# Patient Record
Sex: Female | Born: 1980 | Race: White | Hispanic: No | Marital: Married | Smoking: Never smoker
Health system: Southern US, Community
[De-identification: ages and names within clinical notes are randomized; demographics above are authoritative.]

## PROBLEM LIST (undated history)

## (undated) DIAGNOSIS — Z20828 Contact with and (suspected) exposure to other viral communicable diseases: Secondary | ICD-10-CM

## (undated) DIAGNOSIS — O021 Missed abortion: Secondary | ICD-10-CM

## (undated) DIAGNOSIS — Z8619 Personal history of other infectious and parasitic diseases: Secondary | ICD-10-CM

## (undated) DIAGNOSIS — R112 Nausea with vomiting, unspecified: Secondary | ICD-10-CM

## (undated) DIAGNOSIS — E039 Hypothyroidism, unspecified: Secondary | ICD-10-CM

## (undated) DIAGNOSIS — Z9889 Other specified postprocedural states: Secondary | ICD-10-CM

## (undated) HISTORY — DX: Contact with and (suspected) exposure to other viral communicable diseases: Z20.828

## (undated) HISTORY — PX: WISDOM TOOTH EXTRACTION: SHX21

## (undated) HISTORY — DX: Hypothyroidism, unspecified: E03.9

## (undated) HISTORY — DX: Personal history of other infectious and parasitic diseases: Z86.19

---

## 2001-04-07 ENCOUNTER — Ambulatory Visit (HOSPITAL_COMMUNITY): Admission: RE | Admit: 2001-04-07 | Discharge: 2001-04-07 | Payer: Self-pay | Admitting: *Deleted

## 2001-04-07 ENCOUNTER — Encounter: Payer: Self-pay | Admitting: *Deleted

## 2004-12-15 ENCOUNTER — Emergency Department (HOSPITAL_COMMUNITY): Admission: EM | Admit: 2004-12-15 | Discharge: 2004-12-15 | Payer: Self-pay | Admitting: Family Medicine

## 2005-11-29 ENCOUNTER — Other Ambulatory Visit: Admission: RE | Admit: 2005-11-29 | Discharge: 2005-11-29 | Payer: Self-pay | Admitting: Internal Medicine

## 2006-12-05 ENCOUNTER — Other Ambulatory Visit: Admission: RE | Admit: 2006-12-05 | Discharge: 2006-12-05 | Payer: Self-pay | Admitting: *Deleted

## 2009-08-20 ENCOUNTER — Inpatient Hospital Stay (HOSPITAL_COMMUNITY): Admission: AD | Admit: 2009-08-20 | Discharge: 2009-08-22 | Payer: Self-pay | Admitting: Obstetrics and Gynecology

## 2010-05-24 LAB — CBC
HCT: 38.1 % (ref 36.0–46.0)
HCT: 42.1 % (ref 36.0–46.0)
Hemoglobin: 13.4 g/dL (ref 12.0–15.0)
Hemoglobin: 14.7 g/dL (ref 12.0–15.0)
MCHC: 35 g/dL (ref 30.0–36.0)
MCHC: 35.2 g/dL (ref 30.0–36.0)
MCV: 96.3 fL (ref 78.0–100.0)
MCV: 97.7 fL (ref 78.0–100.0)
Platelets: 170 10*3/uL (ref 150–400)
Platelets: 188 10*3/uL (ref 150–400)
RBC: 3.9 MIL/uL (ref 3.87–5.11)
RBC: 4.37 MIL/uL (ref 3.87–5.11)
RDW: 12.3 % (ref 11.5–15.5)
RDW: 12.7 % (ref 11.5–15.5)
WBC: 13.5 10*3/uL — ABNORMAL HIGH (ref 4.0–10.5)
WBC: 15.3 10*3/uL — ABNORMAL HIGH (ref 4.0–10.5)

## 2010-05-24 LAB — RPR: RPR Ser Ql: NONREACTIVE

## 2011-01-14 LAB — OB RESULTS CONSOLE RPR: RPR: NONREACTIVE

## 2011-01-14 LAB — OB RESULTS CONSOLE HIV ANTIBODY (ROUTINE TESTING): HIV: NONREACTIVE

## 2011-01-14 LAB — OB RESULTS CONSOLE ABO/RH: RH Type: POSITIVE

## 2011-02-01 LAB — OB RESULTS CONSOLE GC/CHLAMYDIA
Chlamydia: NEGATIVE
Gonorrhea: NEGATIVE

## 2011-08-12 LAB — OB RESULTS CONSOLE GBS: GBS: NEGATIVE

## 2011-08-24 ENCOUNTER — Encounter (HOSPITAL_COMMUNITY): Payer: Self-pay | Admitting: *Deleted

## 2011-08-24 ENCOUNTER — Telehealth (HOSPITAL_COMMUNITY): Payer: Self-pay | Admitting: *Deleted

## 2011-08-24 NOTE — Telephone Encounter (Signed)
Preadmission screen  

## 2011-08-26 ENCOUNTER — Inpatient Hospital Stay (HOSPITAL_COMMUNITY): Admission: AD | Admit: 2011-08-26 | Payer: Self-pay | Source: Ambulatory Visit | Admitting: Obstetrics and Gynecology

## 2011-08-27 ENCOUNTER — Encounter (HOSPITAL_COMMUNITY): Payer: Self-pay | Admitting: Anesthesiology

## 2011-08-27 ENCOUNTER — Inpatient Hospital Stay (HOSPITAL_COMMUNITY): Payer: BC Managed Care – PPO | Admitting: Anesthesiology

## 2011-08-27 ENCOUNTER — Encounter (HOSPITAL_COMMUNITY): Payer: Self-pay

## 2011-08-27 ENCOUNTER — Inpatient Hospital Stay (HOSPITAL_COMMUNITY)
Admission: RE | Admit: 2011-08-27 | Discharge: 2011-08-29 | DRG: 371 | Disposition: A | Discharge status: Home / Self Care | Payer: BC Managed Care – PPO | Source: Ambulatory Visit | Attending: Obstetrics & Gynecology | Admitting: Obstetrics & Gynecology

## 2011-08-27 ENCOUNTER — Encounter (HOSPITAL_COMMUNITY)
Admission: RE | Disposition: A | Discharge status: Home / Self Care | Payer: Self-pay | Source: Ambulatory Visit | Attending: Obstetrics & Gynecology

## 2011-08-27 VITALS — BP 108/72 | HR 54 | Temp 97.9°F | Resp 18 | Ht 63.0 in | Wt 180.0 lb

## 2011-08-27 DIAGNOSIS — E079 Disorder of thyroid, unspecified: Principal | ICD-10-CM | POA: Diagnosis present

## 2011-08-27 DIAGNOSIS — O99284 Endocrine, nutritional and metabolic diseases complicating childbirth: Principal | ICD-10-CM | POA: Diagnosis present

## 2011-08-27 DIAGNOSIS — E039 Hypothyroidism, unspecified: Secondary | ICD-10-CM | POA: Diagnosis present

## 2011-08-27 LAB — CBC
HCT: 42.2 % (ref 36.0–46.0)
Hemoglobin: 14.8 g/dL (ref 12.0–15.0)
MCH: 32.1 pg (ref 26.0–34.0)
MCHC: 35.1 g/dL (ref 30.0–36.0)
MCV: 91.5 fL (ref 78.0–100.0)
Platelets: 180 10*3/uL (ref 150–400)
RBC: 4.61 MIL/uL (ref 3.87–5.11)
RDW: 12.9 % (ref 11.5–15.5)
WBC: 10.6 10*3/uL — ABNORMAL HIGH (ref 4.0–10.5)

## 2011-08-27 LAB — RPR: RPR Ser Ql: NONREACTIVE

## 2011-08-27 LAB — TYPE AND SCREEN
ABO/RH(D): A POS
Antibody Screen: NEGATIVE

## 2011-08-27 LAB — ABO/RH: ABO/RH(D): A POS

## 2011-08-27 SURGERY — Surgical Case
Anesthesia: Epidural | Site: Abdomen | Wound class: Clean Contaminated

## 2011-08-27 MED ORDER — SIMETHICONE 80 MG PO CHEW: 80.0000 mg | CHEWABLE_TABLET | ORAL | Status: DC | PRN

## 2011-08-27 MED ORDER — SODIUM BICARBONATE 8.4 % IV SOLN
INTRAVENOUS | Status: DC | PRN
  Administered 2011-08-27: 4 mL via EPIDURAL

## 2011-08-27 MED ORDER — FENTANYL 2.5 MCG/ML BUPIVACAINE 1/10 % EPIDURAL INFUSION (WH - ANES)
14.0000 mL/h | INTRAMUSCULAR | Status: DC
  Filled 2011-08-27: qty 60

## 2011-08-27 MED ORDER — KETOROLAC TROMETHAMINE 30 MG/ML IJ SOLN: 30.0000 mg | Freq: Four times a day (QID) | INTRAMUSCULAR | Status: AC | PRN

## 2011-08-27 MED ORDER — DOCUSATE SODIUM 100 MG PO CAPS: 200.0000 mg | ORAL_CAPSULE | Freq: Every day | ORAL | Status: DC | PRN

## 2011-08-27 MED ORDER — CITRIC ACID-SODIUM CITRATE 334-500 MG/5ML PO SOLN
ORAL | Status: AC
  Administered 2011-08-27: 30 mL via ORAL
  Filled 2011-08-27: qty 15

## 2011-08-27 MED ORDER — LANOLIN HYDROUS EX OINT: 1.0000 "application " | TOPICAL_OINTMENT | CUTANEOUS | Status: DC | PRN

## 2011-08-27 MED ORDER — OXYTOCIN 40 UNITS IN LACTATED RINGERS INFUSION - SIMPLE MED
INTRAVENOUS | Status: DC | PRN
  Administered 2011-08-27: 40 [IU] via INTRAVENOUS

## 2011-08-27 MED ORDER — PHENYLEPHRINE 40 MCG/ML (10ML) SYRINGE FOR IV PUSH (FOR BLOOD PRESSURE SUPPORT): 80.0000 ug | PREFILLED_SYRINGE | INTRAVENOUS | Status: DC | PRN

## 2011-08-27 MED ORDER — IBUPROFEN 600 MG PO TABS: 600.0000 mg | ORAL_TABLET | Freq: Four times a day (QID) | ORAL | Status: DC | PRN

## 2011-08-27 MED ORDER — LACTATED RINGERS IV SOLN: 500.0000 mL | INTRAVENOUS | Status: DC | PRN

## 2011-08-27 MED ORDER — ONDANSETRON HCL 4 MG/2ML IJ SOLN: 4.0000 mg | INTRAMUSCULAR | Status: DC | PRN

## 2011-08-27 MED ORDER — LORATADINE 10 MG PO TABS
10.0000 mg | ORAL_TABLET | Freq: Every day | ORAL | Status: DC
  Administered 2011-08-28: 10 mg via ORAL
  Filled 2011-08-27 (×3): qty 1

## 2011-08-27 MED ORDER — MORPHINE SULFATE (PF) 0.5 MG/ML IJ SOLN
INTRAMUSCULAR | Status: DC | PRN
  Administered 2011-08-27: 1 mg via INTRAVENOUS
  Administered 2011-08-27: 4 mg via EPIDURAL

## 2011-08-27 MED ORDER — KETOROLAC TROMETHAMINE 30 MG/ML IJ SOLN
INTRAMUSCULAR | Status: AC
  Administered 2011-08-27: 30 mg via INTRAVENOUS
  Filled 2011-08-27: qty 1

## 2011-08-27 MED ORDER — OXYTOCIN 40 UNITS IN LACTATED RINGERS INFUSION - SIMPLE MED
1.0000 m[IU]/min | INTRAVENOUS | Status: DC
  Administered 2011-08-27: 2 m[IU]/min via INTRAVENOUS
  Filled 2011-08-27: qty 1000

## 2011-08-27 MED ORDER — SENNOSIDES-DOCUSATE SODIUM 8.6-50 MG PO TABS
2.0000 | ORAL_TABLET | Freq: Every day | ORAL | Status: DC
  Administered 2011-08-27 – 2011-08-28 (×2): 2 via ORAL

## 2011-08-27 MED ORDER — LACTATED RINGERS IV SOLN
INTRAVENOUS | Status: DC | PRN
  Administered 2011-08-27 (×2): via INTRAVENOUS

## 2011-08-27 MED ORDER — NALBUPHINE HCL 10 MG/ML IJ SOLN: 5.0000 mg | INTRAMUSCULAR | Status: DC | PRN

## 2011-08-27 MED ORDER — IBUPROFEN 600 MG PO TABS
600.0000 mg | ORAL_TABLET | Freq: Four times a day (QID) | ORAL | Status: DC
  Administered 2011-08-27 – 2011-08-29 (×6): 600 mg via ORAL
  Filled 2011-08-27 (×6): qty 1

## 2011-08-27 MED ORDER — HYDROMORPHONE HCL PF 1 MG/ML IJ SOLN
INTRAMUSCULAR | Status: AC
  Filled 2011-08-27: qty 1

## 2011-08-27 MED ORDER — SCOPOLAMINE 1 MG/3DAYS TD PT72
MEDICATED_PATCH | TRANSDERMAL | Status: AC
  Filled 2011-08-27: qty 1

## 2011-08-27 MED ORDER — DIPHENHYDRAMINE HCL 25 MG PO CAPS: 25.0000 mg | ORAL_CAPSULE | Freq: Four times a day (QID) | ORAL | Status: DC | PRN

## 2011-08-27 MED ORDER — EPHEDRINE 5 MG/ML INJ
10.0000 mg | INTRAVENOUS | Status: DC | PRN
  Filled 2011-08-27: qty 4

## 2011-08-27 MED ORDER — CITRIC ACID-SODIUM CITRATE 334-500 MG/5ML PO SOLN: 30.0000 mL | ORAL | Status: DC | PRN

## 2011-08-27 MED ORDER — SCOPOLAMINE 1 MG/3DAYS TD PT72
1.0000 | MEDICATED_PATCH | Freq: Once | TRANSDERMAL | Status: DC
  Administered 2011-08-27: 1.5 mg via TRANSDERMAL

## 2011-08-27 MED ORDER — PHENYLEPHRINE 40 MCG/ML (10ML) SYRINGE FOR IV PUSH (FOR BLOOD PRESSURE SUPPORT)
PREFILLED_SYRINGE | INTRAVENOUS | Status: AC
  Filled 2011-08-27: qty 5

## 2011-08-27 MED ORDER — SODIUM CHLORIDE 0.9 % IJ SOLN: 3.0000 mL | INTRAMUSCULAR | Status: DC | PRN

## 2011-08-27 MED ORDER — MORPHINE SULFATE 0.5 MG/ML IJ SOLN
INTRAMUSCULAR | Status: AC
  Filled 2011-08-27: qty 10

## 2011-08-27 MED ORDER — ONDANSETRON HCL 4 MG/2ML IJ SOLN
4.0000 mg | Freq: Four times a day (QID) | INTRAMUSCULAR | Status: DC | PRN
  Filled 2011-08-27: qty 2

## 2011-08-27 MED ORDER — LACTATED RINGERS IV SOLN
INTRAVENOUS | Status: DC
  Administered 2011-08-27 (×2): via INTRAVENOUS

## 2011-08-27 MED ORDER — PRENATAL MULTIVITAMIN CH
1.0000 | ORAL_TABLET | Freq: Every day | ORAL | Status: DC
  Administered 2011-08-28: 1 via ORAL
  Filled 2011-08-27: qty 1

## 2011-08-27 MED ORDER — SIMETHICONE 80 MG PO CHEW
80.0000 mg | CHEWABLE_TABLET | Freq: Three times a day (TID) | ORAL | Status: DC
  Administered 2011-08-27 – 2011-08-29 (×4): 80 mg via ORAL

## 2011-08-27 MED ORDER — PHENYLEPHRINE 40 MCG/ML (10ML) SYRINGE FOR IV PUSH (FOR BLOOD PRESSURE SUPPORT)
80.0000 ug | PREFILLED_SYRINGE | INTRAVENOUS | Status: DC | PRN
  Filled 2011-08-27: qty 5

## 2011-08-27 MED ORDER — NALOXONE HCL 0.4 MG/ML IJ SOLN
0.4000 mg | INTRAMUSCULAR | Status: DC | PRN
Start: 2011-08-27 — End: 2011-08-29

## 2011-08-27 MED ORDER — MENTHOL 3 MG MT LOZG: 1.0000 | LOZENGE | OROMUCOSAL | Status: DC | PRN

## 2011-08-27 MED ORDER — KETOROLAC TROMETHAMINE 60 MG/2ML IM SOLN: 60.0000 mg | Freq: Once | INTRAMUSCULAR | Status: AC | PRN

## 2011-08-27 MED ORDER — DIPHENHYDRAMINE HCL 50 MG/ML IJ SOLN
25.0000 mg | INTRAMUSCULAR | Status: DC | PRN
Start: 2011-08-27 — End: 2011-08-29

## 2011-08-27 MED ORDER — CEFAZOLIN SODIUM 1-5 GM-% IV SOLN
INTRAVENOUS | Status: DC | PRN
  Administered 2011-08-27: 2 g via INTRAVENOUS

## 2011-08-27 MED ORDER — LIDOCAINE HCL (PF) 1 % IJ SOLN: 30.0000 mL | INTRAMUSCULAR | Status: DC | PRN

## 2011-08-27 MED ORDER — SODIUM CHLORIDE 0.9 % IV SOLN: 1.0000 ug/kg/h | INTRAVENOUS | Status: DC | PRN

## 2011-08-27 MED ORDER — ONDANSETRON HCL 4 MG/2ML IJ SOLN
INTRAMUSCULAR | Status: AC
  Filled 2011-08-27: qty 2

## 2011-08-27 MED ORDER — DIPHENHYDRAMINE HCL 50 MG/ML IJ SOLN
12.5000 mg | INTRAMUSCULAR | Status: DC | PRN
  Administered 2011-08-28: 12.5 mg via INTRAVENOUS
  Filled 2011-08-27: qty 1

## 2011-08-27 MED ORDER — DIPHENHYDRAMINE HCL 50 MG/ML IJ SOLN: 12.5000 mg | INTRAMUSCULAR | Status: DC | PRN

## 2011-08-27 MED ORDER — DIPHENHYDRAMINE HCL 25 MG PO CAPS: 25.0000 mg | ORAL_CAPSULE | ORAL | Status: DC | PRN

## 2011-08-27 MED ORDER — WITCH HAZEL-GLYCERIN EX PADS: 1.0000 "application " | MEDICATED_PAD | CUTANEOUS | Status: DC | PRN

## 2011-08-27 MED ORDER — LACTATED RINGERS IV SOLN
INTRAVENOUS | Status: DC
  Administered 2011-08-27 (×2): via INTRAVENOUS

## 2011-08-27 MED ORDER — LORATADINE 10 MG PO TABS
10.0000 mg | ORAL_TABLET | Freq: Every day | ORAL | Status: DC
  Filled 2011-08-27 (×2): qty 1

## 2011-08-27 MED ORDER — ONDANSETRON HCL 4 MG PO TABS: 4.0000 mg | ORAL_TABLET | ORAL | Status: DC | PRN

## 2011-08-27 MED ORDER — ACETAMINOPHEN 325 MG PO TABS: 650.0000 mg | ORAL_TABLET | ORAL | Status: DC | PRN

## 2011-08-27 MED ORDER — EPHEDRINE 5 MG/ML INJ: 10.0000 mg | INTRAVENOUS | Status: DC | PRN

## 2011-08-27 MED ORDER — PRENATAL MULTIVITAMIN CH: 1.0000 | ORAL_TABLET | Freq: Every day | ORAL | Status: DC

## 2011-08-27 MED ORDER — OXYCODONE-ACETAMINOPHEN 5-325 MG PO TABS
1.0000 | ORAL_TABLET | ORAL | Status: DC | PRN
  Administered 2011-08-28 – 2011-08-29 (×4): 1 via ORAL
  Filled 2011-08-27 (×4): qty 1

## 2011-08-27 MED ORDER — HYDROMORPHONE HCL PF 1 MG/ML IJ SOLN
0.2500 mg | INTRAMUSCULAR | Status: DC | PRN
  Administered 2011-08-27: 0.5 mg via INTRAVENOUS
  Administered 2011-08-27 (×3): 0.25 mg via INTRAVENOUS

## 2011-08-27 MED ORDER — METOCLOPRAMIDE HCL 5 MG/ML IJ SOLN
10.0000 mg | Freq: Three times a day (TID) | INTRAMUSCULAR | Status: DC | PRN
  Filled 2011-08-27: qty 2

## 2011-08-27 MED ORDER — DIBUCAINE 1 % RE OINT: 1.0000 "application " | TOPICAL_OINTMENT | RECTAL | Status: DC | PRN

## 2011-08-27 MED ORDER — TETANUS-DIPHTH-ACELL PERTUSSIS 5-2.5-18.5 LF-MCG/0.5 IM SUSP: 0.5000 mL | Freq: Once | INTRAMUSCULAR | Status: DC

## 2011-08-27 MED ORDER — LEVOTHYROXINE SODIUM 125 MCG PO TABS
125.0000 ug | ORAL_TABLET | Freq: Every day | ORAL | Status: DC
  Administered 2011-08-28 – 2011-08-29 (×2): 125 ug via ORAL
  Filled 2011-08-27 (×2): qty 1

## 2011-08-27 MED ORDER — FENTANYL 2.5 MCG/ML BUPIVACAINE 1/10 % EPIDURAL INFUSION (WH - ANES)
INTRAMUSCULAR | Status: DC | PRN
  Administered 2011-08-27: 13 mL/h via EPIDURAL

## 2011-08-27 MED ORDER — MEPERIDINE HCL 25 MG/ML IJ SOLN: 6.2500 mg | INTRAMUSCULAR | Status: DC | PRN

## 2011-08-27 MED ORDER — EPHEDRINE 5 MG/ML INJ
INTRAVENOUS | Status: AC
  Filled 2011-08-27: qty 10

## 2011-08-27 MED ORDER — FLUTICASONE PROPIONATE 50 MCG/ACT NA SUSP
2.0000 | Freq: Every day | NASAL | Status: DC | PRN
  Filled 2011-08-27: qty 16

## 2011-08-27 MED ORDER — ONDANSETRON HCL 4 MG/2ML IJ SOLN: 4.0000 mg | Freq: Three times a day (TID) | INTRAMUSCULAR | Status: DC | PRN

## 2011-08-27 MED ORDER — OXYTOCIN 40 UNITS IN LACTATED RINGERS INFUSION - SIMPLE MED: 62.5000 mL/h | INTRAVENOUS | Status: AC

## 2011-08-27 MED ORDER — ZOLPIDEM TARTRATE 5 MG PO TABS: 5.0000 mg | ORAL_TABLET | Freq: Every evening | ORAL | Status: DC | PRN

## 2011-08-27 MED ORDER — OXYTOCIN 10 UNIT/ML IJ SOLN
INTRAMUSCULAR | Status: AC
  Filled 2011-08-27: qty 4

## 2011-08-27 MED ORDER — LACTATED RINGERS IV SOLN: 500.0000 mL | Freq: Once | INTRAVENOUS | Status: DC

## 2011-08-27 SURGICAL SUPPLY — 45 items
APL SKNCLS STERI-STRIP NONHPOA (GAUZE/BANDAGES/DRESSINGS) ×1
BENZOIN TINCTURE PRP APPL 2/3 (GAUZE/BANDAGES/DRESSINGS) ×1 IMPLANT
CHLORAPREP W/TINT 26ML (MISCELLANEOUS) ×1 IMPLANT
CLOTH BEACON ORANGE TIMEOUT ST (SAFETY) ×2 IMPLANT
CONTAINER PREFILL 10% NBF 15ML (MISCELLANEOUS) IMPLANT
DRESSING TELFA 8X3 (GAUZE/BANDAGES/DRESSINGS) ×2 IMPLANT
DRSG COVADERM 4X10 (GAUZE/BANDAGES/DRESSINGS) ×1 IMPLANT
ELECT REM PT RETURN 9FT ADLT (ELECTROSURGICAL) ×2
ELECTRODE REM PT RTRN 9FT ADLT (ELECTROSURGICAL) ×1 IMPLANT
EXTRACTOR VACUUM KIWI (MISCELLANEOUS) IMPLANT
EXTRACTOR VACUUM M CUP 4 TUBE (SUCTIONS) ×1 IMPLANT
GAUZE SPONGE 4X4 12PLY STRL LF (GAUZE/BANDAGES/DRESSINGS) ×4 IMPLANT
GLOVE BIO SURGEON STRL SZ7 (GLOVE) ×2 IMPLANT
GLOVE BIOGEL PI IND STRL 7.0 (GLOVE) ×2 IMPLANT
GLOVE BIOGEL PI INDICATOR 7.0 (GLOVE) ×2
GLOVE SURG SS PI 6.5 STRL IVOR (GLOVE) ×4 IMPLANT
GLOVE SURG SS PI 7.0 STRL IVOR (GLOVE) ×4 IMPLANT
GOWN PREVENTION PLUS LG XLONG (DISPOSABLE) ×7 IMPLANT
KIT ABG SYR 3ML LUER SLIP (SYRINGE) ×1 IMPLANT
NDL HYPO 25X5/8 SAFETYGLIDE (NEEDLE) IMPLANT
NEEDLE HYPO 25X5/8 SAFETYGLIDE (NEEDLE) IMPLANT
NS IRRIG 1000ML POUR BTL (IV SOLUTION) ×2 IMPLANT
PACK C SECTION WH (CUSTOM PROCEDURE TRAY) ×2 IMPLANT
PAD ABD 7.5X8 STRL (GAUZE/BANDAGES/DRESSINGS) ×2 IMPLANT
RTRCTR C-SECT PINK 25CM LRG (MISCELLANEOUS) IMPLANT
SLEEVE SCD COMPRESS KNEE MED (MISCELLANEOUS) IMPLANT
SPONGE GAUZE 4X4 12PLY (GAUZE/BANDAGES/DRESSINGS) ×1 IMPLANT
STAPLER VISISTAT 35W (STAPLE) IMPLANT
STRIP CLOSURE SKIN 1/2X4 (GAUZE/BANDAGES/DRESSINGS) ×1 IMPLANT
STRIP CLOSURE SKIN 1/4X4 (GAUZE/BANDAGES/DRESSINGS) IMPLANT
SUT MNCRL 0 VIOLET CTX 36 (SUTURE) ×3 IMPLANT
SUT MONOCRYL 0 CTX 36 (SUTURE) ×3
SUT PLAIN 0 NONE (SUTURE) IMPLANT
SUT PLAIN 2 0 (SUTURE)
SUT PLAIN ABS 2-0 CT1 27XMFL (SUTURE) IMPLANT
SUT VIC AB 0 CT1 27 (SUTURE) ×4
SUT VIC AB 0 CT1 27XBRD ANBCTR (SUTURE) ×2 IMPLANT
SUT VIC AB 2-0 CT1 27 (SUTURE) ×4
SUT VIC AB 2-0 CT1 TAPERPNT 27 (SUTURE) ×2 IMPLANT
SUT VIC AB 4-0 KS 27 (SUTURE) IMPLANT
SUT VICRYL 0 TIES 12 18 (SUTURE) IMPLANT
TAPE CLOTH SURG 4X10 WHT LF (GAUZE/BANDAGES/DRESSINGS) ×1 IMPLANT
TOWEL OR 17X24 6PK STRL BLUE (TOWEL DISPOSABLE) ×4 IMPLANT
TRAY FOLEY CATH 14FR (SET/KITS/TRAYS/PACK) IMPLANT
WATER STERILE IRR 1000ML POUR (IV SOLUTION) ×2 IMPLANT

## 2011-08-27 NOTE — Transfer of Care (Signed)
Immediate Anesthesia Transfer of Care Note  Patient: Crystal Sanchez  Procedure(s) Performed: Procedure(s) (LRB): CESAREAN SECTION (N/A)  Patient Location: PACU  Anesthesia Type: Epidural  Level of Consciousness: awake, alert  and oriented  Airway & Oxygen Therapy: Patient Spontanous Breathing  Post-op Assessment: Report given to PACU RN and Post -op Vital signs reviewed and stable  Post vital signs: Reviewed and stable  Complications: No apparent anesthesia complications

## 2011-08-27 NOTE — Plan of Care (Signed)
Problem: Consults Goal: Birthing Suites Patient Information Press F2 to bring up selections list   Pt 37-[redacted] weeks EGA     

## 2011-08-27 NOTE — Progress Notes (Signed)
Pitocin D/C'd at 1231 per CNM request

## 2011-08-27 NOTE — H&P (Signed)
  OB ADMISSION/ HISTORY & PHYSICAL:  Admission Date: 08/27/2011  7:25 AM  Admit Diagnosis: 39 weeks for induction - hypothyroidism  Crystal Sanchez is a 31 y.o. female presenting for induction of labor for hypothyroidism and patient elective request / favorable Bishop Score.  Prenatal History: G3P1011   EDC : 09/03/2011, by Other Basis  Prenatal care at [facility] with Marlinda Mike CNM as primary provider. Prenatal course complicated by hypothyroidism.  Prenatal Labs: ABO, Rh: A (11/08 0000) positive Antibody: Negative (11/08 0000) Rubella: Immune (11/08 0000)  RPR: Nonreactive (11/08 0000)  HBsAg: Negative (11/08 0000)  HIV: Non-reactive (11/08 0000)  GBS: Negative (06/06 0000)  1 hr Glucola : normal   Medical / Surgical History :  Past medical history:  Past Medical History  Diagnosis Date  . H/O varicella   . Contact with or exposure to other viral diseases   . Hypothyroidism      Past surgical history:  Past Surgical History  Procedure Date  . Wisdom tooth extraction      Family History:  Family History  Problem Relation Age of Onset  . Hyperlipidemia Mother   . Meniere's disease Mother   . Hyperlipidemia Father   . Hypertension Maternal Grandmother   . Heart attack Maternal Grandmother   . Diabetes Maternal Grandmother   . Heart disease Maternal Grandmother   . Hypertension Maternal Grandfather   . Heart attack Maternal Grandfather   . Diabetes Maternal Grandfather   . Heart disease Maternal Grandfather   . Cancer Maternal Grandfather     brain  . Heart disease Paternal Grandfather   . Hypertension Paternal Grandfather      Social History:  reports that she has never smoked. She has never used smokeless tobacco. She reports that she does not drink alcohol or use illicit drugs.   Allergies: Review of patient's allergies indicates no known allergies.    Current Medications at time of admission:  Synthroid daily Zyrtec  10 mg daily Flonase 2 sprays per nares per day Prenatal daily daily  Review of Systems: Active FM Some mild irregular ctx No bleeding or vaginal discharge No LOF  Physical Exam: General: alert and oriented / NAD / calm Heart: RR Lungs: Unlabored and clear Abdomen: gravid and non-tender Extremities: no edema Genitalia / VE: 3 / 70 / vtx / -1  FHR: reactive from 145 baseline / category 1 TOCO: ctx irregular and mild   Assessment: 39 weeks with favorable Bishop's for patient requested induction Hypothyroidism - well controlled with synthroid  Plan:  Admit Pitocin until adequate labor pattern AROM Epidural in labor  Marlinda Mike CNM,MSN 08/27/2011, 8:32 AM

## 2011-08-27 NOTE — Progress Notes (Signed)
CNM examin pt, cord prolapse. Student CNM holding head vaginally. OR and anesth notified, pt prepped given bicitra and moved to OR via ped

## 2011-08-27 NOTE — Consult Note (Signed)
Neonatology Note:   Attendance at C-section:    I was asked to attend this Stat primary C/S at term due to prolapsed cord. The mother is a G3P1A1 A pos, GBS neg with hypothyroidism, on Synthroid. ROM 1 1/2 hours prior to delivery, fluid clear. Infant vigorous with good spontaneous cry and tone. Needed only minimal bulb suctioning. Ap 9/9. Lungs clear to ausc in DR. To CN to care of Pediatrician.   Deatra James, MD

## 2011-08-27 NOTE — Progress Notes (Signed)
Patient ID: Crystal Sanchez, female   DOB: 01/28/1981, 31 y.o.   MRN: [id number]  S: Feeling ok - requesting epidural when she can have it     Tolerating contractions well   O:  VS: Blood pressure 131/81, pulse 67, temperature 97.8 F (36.6 C), temperature source Oral, resp. rate 18, height 5\' 3"  (1.6 m), weight 81.647 kg (180 lb).        FHR : baseline 145 / variability moderate / accels +  / decels none        Toco: contractions every 2 minutes / pitocin at 6 mu/min         Cervix : 3-4 / 75% / vtx / -1 / BBOW        Membranes: AROM - large clear  A: active labor     FHR category 1  P: expectant management       Epidural prn     Marlinda Mike CNM,MSN 08/27/2011, 11:13 AM

## 2011-08-27 NOTE — Anesthesia Procedure Notes (Signed)

## 2011-08-27 NOTE — Plan of Care (Signed)
Problem: Discharge Progression Outcomes Goal: Remove staples per MD order Outcome: Not Applicable Date Met:  08/27/11 Pt stated MD did dermabond and internal sutures.

## 2011-08-27 NOTE — Op Note (Signed)
Cesarean Section Procedure Note  Indications: Cord prolapse  Pre-operative Diagnosis: 39 week 0 day pregnancy.  Post-operative Diagnosis: same  Surgeon: Lenoard Aden   Assistants: none  Anesthesia: Epidural anesthesia  ASA Class: 2  Procedure Details  The patient was seen in the Holding Room. The risks, benefits, complications, treatment options, and expected outcomes were discussed with the patient.  The patient concurred with the proposed plan, giving informed consent. The risks of anesthesia, infection, bleeding and possible injury to other organs discussed. Injury to bowel, bladder, or ureter with possible need for repair discussed. Possible need for transfusion with secondary risks of hepatitis or HIV acquisition discussed. Post operative complications to include but not limited to DVT, PE and Pneumonia noted. The site of surgery properly noted/marked. The patient was taken to Operating Room # 1, identified as ANNALEESE GUIER and the procedure verified as C-Section Delivery. A Time Out was held and the above information confirmed.  After induction of anesthesia, the patient was draped and prepped in the usual sterile manner. A Pfannenstiel incision was made and carried down through the subcutaneous tissue to the fascia. Fascial incision was made and extended transversely using Mayo scissors. The fascia was separated from the underlying rectus tissue superiorly and inferiorly. The peritoneum was identified and entered. Peritoneal incision was extended longitudinally. The utero-vesical peritoneal reflection was incised transversely and the bladder flap was bluntly freed from the lower uterine segment. A low transverse uterine incision(Kerr hysterotomy) was made. Delivered from OA presentation(vacuum assistance x one pull) was a  female with Apgar scores of 9 at one minute and 9 at five minutes. Bulb suctioning gently performed. Neonatal team in attendance.After the umbilical cord was  clamped and cut cord blood was obtained for evaluation. The placenta was removed intact and appeared normal. The uterus was curetted with a dry lap pack. Good hemostasis was noted.The uterine outline, tubes and ovaries appeared normal. The uterine incision was closed with running locked sutures of 0 Monocryl x 2 layers. Hemostasis was observed. Lavage was carried out until clear.The parietal peritoneum was closed with a running 2-0 Monocryl suture. The fascia was then reapproximated with running sutures of 0 Monocryl. The skin was reapproximated with staples.  Instrument, sponge, and needle counts were correct prior the abdominal closure and at the conclusion of the case.   Findings: Tight nuchal cord x one  Estimated Blood Loss:  500         Drains: foley                 Specimens: placenta to path Cord ph 7.09                 Complications:  None; patient tolerated the procedure well.         Disposition: PACU - hemodynamically stable.         Condition: stable  Attending Attestation: I was present and scrubbed for the key portions of the procedure.

## 2011-08-27 NOTE — Progress Notes (Signed)
Patient ID: MAYCIE LUERA, female   DOB: 01-14-1981, 31 y.o.   MRN: [id number]  Reviewed FHR tracing - FHR variables present - bradycardia noted on tracing to 60-70. In room to evaluate FHR decels - no call by nursing staff  Recent epidural   VS: BP 87/45 upon arrival to room / nurse administered ephedrine dose  FHR baseline 150 - prolonged variables to 60-70 range with interruption in continuous tracing with maternal positioning.  VE : 6 cm / 95 % / Vtx / + 1          Partial cord prolapse thru cervical os from 1 o'clock to 4 o'clock position          Vtx displaced off cord with manual reduction - position held in place with SNM en route to OR  Clear amniotic fluid     A: Active labor   Cord prolapse  P: Stat Cesarean delivery      Call to Dr Billy Coast in house for stat CS      Transport to OR immediately in trendelenburg to prevent cord compression       FHR 70 leaving Birthing suite     Marlinda Mike CNM,MSN 08/27/2011, 1:13 PM

## 2011-08-27 NOTE — Anesthesia Postprocedure Evaluation (Signed)
  Anesthesia Post-op Note  Patient: Crystal Sanchez  Procedure(s) Performed: Procedure(s) (LRB): CESAREAN SECTION (N/A)  Patient is awake, responsive, moving her legs, and has signs of resolution of her numbness. Pain and nausea are reasonably well controlled. Vital signs are stable and clinically acceptable. Oxygen saturation is clinically acceptable. There are no apparent anesthetic complications at this time. Patient is ready for discharge.

## 2011-08-27 NOTE — Anesthesia Preprocedure Evaluation (Signed)

## 2011-08-28 LAB — CBC
HCT: 35.1 % — ABNORMAL LOW (ref 36.0–46.0)
MCV: 93.1 fL (ref 78.0–100.0)
RBC: 3.77 MIL/uL — ABNORMAL LOW (ref 3.87–5.11)
WBC: 15.5 10*3/uL — ABNORMAL HIGH (ref 4.0–10.5)

## 2011-08-28 NOTE — Addendum Note (Signed)
Addendum  created 08/28/11 0903 by Merritt Kibby T Jeremih Dearmas, CRNA   Modules edited:Charges VN, Notes Section    

## 2011-08-28 NOTE — Anesthesia Postprocedure Evaluation (Signed)
  Anesthesia Post-op Note  Patient: Crystal Sanchez  Procedure(s) Performed: Procedure(s) (LRB): CESAREAN SECTION (N/A)  Patient Location: Mother/Baby  Anesthesia Type: Epidural  Level of Consciousness: awake, alert  and oriented  Airway and Oxygen Therapy: Patient Spontanous Breathing  Post-op Pain: none  Post-op Assessment: Post-op Vital signs reviewed, Patient's Cardiovascular Status Stable, No headache, No backache, No residual numbness and No residual motor weakness  Post-op Vital Signs: Reviewed and stable  Complications: No apparent anesthesia complications

## 2011-08-28 NOTE — Addendum Note (Signed)
Addendum  created 08/28/11 1914 by Christene Lye, CRNA   Modules edited:Charges VN, Notes Section

## 2011-08-28 NOTE — Progress Notes (Signed)
Patient ID: JALEXIA LALLI, female   DOB: 1980/06/29, 31 y.o.   MRN: [id number]  POSTOPERATIVE DAY # 1 S/P cesarean section   S:         Reports feeling well             Tolerating po intake / no  nausea / no  vomiting / no flatus / no  BM             Bleeding is light             Pain controlled with long-acting narcotic             Up ad lib / ambulatory  Newborn breast feeding  / female   O:  A & O x 3 NAD             VS: Blood pressure 97/61, pulse 71, temperature 98.2 F (36.8 C), temperature source Oral, resp. rate 16, height 5\' 3"  (1.6 m), weight 81.647 kg (180 lb), SpO2 97.00%, unknown if currently breastfeeding.  LABS: WBC/Hgb/Hct/Plts:  15.5/12.1/35.1/158 (06/22 0532)   I&O:   - 839 ml  Lungs: Clear and unlabored  Heart: regular rate and rhythm / no mumurs  Abdomen: soft, non-tender, non-distended, active BS             Fundus: firm, non-tender, U-1             Dressing Intact            Perineum: no edema  Lochia: light  Extremities:  No edema, no calf pain or tenderness  A:        POD # 1 S/P cesarean section            Stable status  P:        Routine postoperative care              consider early discharge tomorrow     Marlinda Mike CNM,MSN 08/28/2011, 8:32 AM

## 2011-08-29 MED ORDER — OXYCODONE-ACETAMINOPHEN 5-325 MG PO TABS: 1.0000 | ORAL_TABLET | ORAL | Status: AC | PRN

## 2011-08-29 MED ORDER — IBUPROFEN 600 MG PO TABS: 600.0000 mg | ORAL_TABLET | Freq: Four times a day (QID) | ORAL | Status: AC

## 2011-08-29 NOTE — Progress Notes (Signed)
Patient ID: Crystal Sanchez, female   DOB: February 27, 1981, 31 y.o.   MRN: [id number]  POSTOPERATIVE DAY # 2 S/P cesarean section   S:         Reports feeling well - desires early discharge             Tolerating po intake / no nausea / no vomiting / + flatus / no BM             Bleeding is spotting             Pain controlled with motrin and percocet             Up ad lib / ambulatory  Newborn breast feeding  / female   O:  A & O x 3 NAD             VS: Blood pressure 108/72, pulse 54, temperature 97.9 F (36.6 C), temperature source Oral, resp. rate 18, height 5\' 3"  (1.6 m), weight 81.647 kg (180 lb), SpO2 97.00%, unknown if currently breastfeeding.  Lungs: Clear and unlabored  Heart: regular rate and rhythm  Abdomen: soft, non-tender, non-distended              Fundus: firm, non-tender, U-2             Dressing OFF              Incision:  approximated with subcuticular & steri / no erythema / no ecchymosis / no drainage  Perineum: intact  Lochia: light  Extremities: no edema, no calf pain or tenderness  A:        POD # 2 S/P cesarean section            Hypothyroidism - stable  P:        Routine postoperative care              Discharge to home     Marlinda Mike [facility] 08/29/2011, 7:54 AM

## 2011-08-29 NOTE — H&P (Signed)
Reviewed and agree with note V.Ayauna Mcnay, MD 

## 2011-08-29 NOTE — Discharge Summary (Signed)
POSTOPERATIVE DISCHARGE SUMMARY:  Patient ID: Crystal Sanchez MRN: [id number] DOB/AGE: Sep 29, 1980 31 y.o.  Admit date: 08/27/2011 Discharge date:  08/29/2011  Admission Diagnoses:  39 weeks hypothyroidism  Elective IOL  Discharge Diagnoses:   Term Pregnancy-delivered  POD # 2 s/p cesarean section for cord prolapse  Hypothyroidism - stable on meds  Prenatal history: [id number]   EDC : 09/03/2011, by Other Basis  Prenatal care at [facility]  Primary provider : Marlinda Mike CNM Prenatal course complicated by hypothyroidism - stable labs  Prenatal Labs: ABO, Rh: A (11/08 0000) positive Antibody: NEG (06/21 0804) Rubella: Immune (11/08 0000)  RPR: NON REACTIVE (06/21 0804)  HBsAg: Negative (11/08 0000)  HIV: Non-reactive (11/08 0000)  GBS: Negative (06/06 0000)  1 hr Glucola : NL   Medical / Surgical History :  Past medical history:  Past Medical History  Diagnosis Date  . H/O varicella   . Contact with or exposure to other viral diseases   . Hypothyroidism     Past surgical history:  Past Surgical History  Procedure Date  . Wisdom tooth extraction     Family History:  Family History  Problem Relation Age of Onset  . Hyperlipidemia Mother   . Meniere's disease Mother   . Hyperlipidemia Father   . Hypertension Maternal Grandmother   . Heart attack Maternal Grandmother   . Diabetes Maternal Grandmother   . Heart disease Maternal Grandmother   . Hypertension Maternal Grandfather   . Heart attack Maternal Grandfather   . Diabetes Maternal Grandfather   . Heart disease Maternal Grandfather   . Cancer Maternal Grandfather     brain  . Heart disease Paternal Grandfather   . Hypertension Paternal Grandfather     Social History:  reports that she has never smoked. She has never used smokeless tobacco. She reports that she does not drink alcohol or use illicit drugs.   Allergies: Review of patient's allergies indicates no known allergies.     Current Medications at time of admission:  Prior to Admission medications   Medication Sig Start Date End Date Taking? Authorizing Provider  cetirizine (ZYRTEC) 10 MG tablet Take 10 mg by mouth daily.   Yes Historical Provider, MD  docusate sodium (COLACE) 100 MG capsule Take 200 mg by mouth daily as needed. For constipation   Yes Historical Provider, MD  fluticasone (FLONASE) 50 MCG/ACT nasal spray Place 2 sprays into the nose daily as needed. For allergies   Yes Historical Provider, MD  levothyroxine (SYNTHROID, LEVOTHROID) 125 MCG tablet Take 125 mcg by mouth daily before breakfast.   Yes Historical Provider, MD  Prenatal Vit-Fe Fumarate-FA (PRENATAL MULTIVITAMIN) TABS Take 1 tablet by mouth daily.   Yes Historical Provider, MD    Intrapartum Course:  Admit for IOL - favorable Bishops / previous SVB with 6 hour labor Pitocin low dose protocol AROM - clear fluid Epidural anesthesia Cord prolapse within hour of epidural placement ( 6cm) / fetal bradycardia 60-70 x 6 minutes Stat CS  Procedures: Cesarean section delivery of female newborn by Dr Billy Coast / Dr Juliene Pina  See operative report for further details  Postoperative / postpartum course: uneventful - discharge POD #2  Physical Exam:   VSS: Blood pressure 108/72, pulse 54, temperature 97.9 F (36.6 C), temperature source Oral, resp. rate 18, height 5\' 3"  (1.6 m), weight 81.647 kg (180 lb), SpO2 97.00%, unknown if currently breastfeeding.  LABS:   preop hgb 14.8 / postop hgb 12.1 General: Calm/ NAD Heart:RR  Lungs:Clear and unlabored Abdomen:Active BS / soft / non-distended Extremities: no edema   Incision:  approximated with subcuticular & steri / no erythema / no ecchymosis / no drainage  Discharge Instructions:  Discharged Condition: stable Activity: pelvic rest and postoperative restrictions x 2 weeks Diet: routine Medications: see below Medication List  As of 08/29/2011  7:57 AM   TAKE these medications          cetirizine 10 MG tablet   Commonly known as: ZYRTEC   Take 10 mg by mouth daily.      docusate sodium 100 MG capsule   Commonly known as: COLACE   Take 200 mg by mouth daily as needed. For constipation      fluticasone 50 MCG/ACT nasal spray   Commonly known as: FLONASE   Place 2 sprays into the nose daily as needed. For allergies      ibuprofen 600 MG tablet   Commonly known as: ADVIL,MOTRIN   Take 1 tablet (600 mg total) by mouth every 6 (six) hours.      levothyroxine 125 MCG tablet   Commonly known as: SYNTHROID, LEVOTHROID   Take 125 mcg by mouth daily before breakfast.      oxyCODONE-acetaminophen 5-325 MG per tablet   Commonly known as: PERCOCET   Take 1 tablet by mouth every 4 (four) hours as needed (moderate - severe pain).      prenatal multivitamin Tabs   Take 1 tablet by mouth daily.           Condition: stable Postpartum Instructions: refer to practice specific booklet Discharge to: home Disposition:  Follow up :  Follow-up Information    Follow up with Marlinda Mike, CNM. Schedule an appointment as soon as possible for a visit in 6 weeks.   Contact information:   [address] [place] [address] [phone]           Signed: Marlinda Mike [facility] 08/29/2011, 7:57 AM

## 2011-08-30 ENCOUNTER — Encounter (HOSPITAL_COMMUNITY): Payer: Self-pay | Admitting: Obstetrics & Gynecology

## 2014-01-07 ENCOUNTER — Encounter (HOSPITAL_COMMUNITY): Payer: Self-pay | Admitting: Obstetrics & Gynecology

## 2014-02-11 LAB — OB RESULTS CONSOLE HEPATITIS B SURFACE ANTIGEN: HEP B S AG: NEGATIVE

## 2014-02-11 LAB — OB RESULTS CONSOLE ABO/RH: RH Type: POSITIVE

## 2014-02-11 LAB — OB RESULTS CONSOLE RPR: RPR: NONREACTIVE

## 2014-02-11 LAB — OB RESULTS CONSOLE ANTIBODY SCREEN: ANTIBODY SCREEN: NEGATIVE

## 2014-02-11 LAB — OB RESULTS CONSOLE RUBELLA ANTIBODY, IGM: Rubella: IMMUNE

## 2014-02-11 LAB — OB RESULTS CONSOLE HIV ANTIBODY (ROUTINE TESTING): HIV: NONREACTIVE

## 2014-08-14 ENCOUNTER — Other Ambulatory Visit: Payer: Self-pay | Admitting: Obstetrics and Gynecology

## 2014-08-16 ENCOUNTER — Other Ambulatory Visit: Payer: Self-pay | Admitting: Obstetrics and Gynecology

## 2014-09-03 ENCOUNTER — Encounter (HOSPITAL_COMMUNITY): Payer: Self-pay

## 2014-09-04 ENCOUNTER — Encounter (HOSPITAL_COMMUNITY): Payer: Self-pay

## 2014-09-04 ENCOUNTER — Encounter (HOSPITAL_COMMUNITY)
Admission: RE | Admit: 2014-09-04 | Discharge: 2014-09-04 | Disposition: A | Discharge status: Home / Self Care | Payer: 59 | Source: Ambulatory Visit | Attending: Obstetrics and Gynecology | Admitting: Obstetrics and Gynecology

## 2014-09-04 HISTORY — DX: Other specified postprocedural states: Z98.890

## 2014-09-04 HISTORY — DX: Missed abortion: O02.1

## 2014-09-04 HISTORY — DX: Nausea with vomiting, unspecified: R11.2

## 2014-09-04 LAB — CBC
HCT: 40.2 % (ref 36.0–46.0)
Hemoglobin: 14.5 g/dL (ref 12.0–15.0)
MCH: 33.9 pg (ref 26.0–34.0)
MCHC: 36.1 g/dL — ABNORMAL HIGH (ref 30.0–36.0)
MCV: 93.9 fL (ref 78.0–100.0)
Platelets: 146 10*3/uL — ABNORMAL LOW (ref 150–400)
RBC: 4.28 MIL/uL (ref 3.87–5.11)
RDW: 12.9 % (ref 11.5–15.5)
WBC: 10.9 10*3/uL — AB (ref 4.0–10.5)

## 2014-09-04 LAB — TYPE AND SCREEN
ABO/RH(D): A POS
Antibody Screen: NEGATIVE

## 2014-09-04 NOTE — H&P (Signed)
Crystal Sanchez is a 34 y.o. female presenting for rpt csection and TL. Maternal Medical History:  Contractions: Onset was less than 1 hour ago.   Frequency: rare.   Perceived severity is mild.    Fetal activity: Perceived fetal activity is normal.   Last perceived fetal movement was within the past hour.    Prenatal complications: no prenatal complications Prenatal Complications - Diabetes: none.    OB History    Gravida Para Term Preterm AB TAB SAB Ectopic Multiple Living   Past Medical History  Diagnosis Date  . H/O varicella   . Contact with or exposure to other viral diseases(V01.79)   . Hypothyroidism   . SVD (spontaneous vaginal delivery)     x 1  . PONV (postoperative nausea and vomiting)   . Missed abortion     x 2 - no surgery required   Past Surgical History  Procedure Laterality Date  . Wisdom tooth extraction    . Cesarean section  08/27/2011    Procedure: CESAREAN SECTION;  Surgeon: Robley Fries, MD;  Location: [facility];  Service: Gynecology;  Laterality: N/A;   Family History: family history includes Cancer in her maternal grandfather; Diabetes in her maternal grandfather and maternal grandmother; Heart attack in her maternal grandfather and maternal grandmother; Heart disease in her maternal grandfather, maternal grandmother, and paternal grandfather; Hyperlipidemia in her father and mother; Hypertension in her maternal grandfather, maternal grandmother, and paternal grandfather; Meniere's disease in her mother. Social History:  reports that she has never smoked. She has never used smokeless tobacco. She reports that she does not drink alcohol or use illicit drugs.   Prenatal Transfer Tool  Maternal Diabetes: No Genetic Screening: Normal Maternal Ultrasounds/Referrals: Normal Fetal Ultrasounds or other Referrals:  None Maternal Substance Abuse:  No Significant Maternal Medications:  None Significant Maternal Lab Results:   None Other Comments:  None  Review of Systems  Constitutional: Negative.   All other systems reviewed and are negative.     Last menstrual period 12/05/2013, unknown if currently breastfeeding. Maternal Exam:  Uterine Assessment: Contraction strength is mild.  Contraction frequency is rare.   Abdomen: Patient reports no abdominal tenderness. Surgical scars: low transverse.   Fetal presentation: vertex  Introitus: Normal vulva. Normal vagina.  Ferning test: not done.  Nitrazine test: not done. Amniotic fluid character: not assessed.  Pelvis: questionable for delivery.   Cervix: Cervix evaluated by digital exam.     Physical Exam  Nursing note and vitals reviewed. Constitutional: She is oriented to person, place, and time. She appears well-developed and well-nourished.  Eyes: Pupils are equal, round, and reactive to light.  Neck: Normal range of motion. Neck supple.  Cardiovascular: Normal rate and regular rhythm.   Respiratory: Effort normal and breath sounds normal.  GI: Soft. Bowel sounds are normal.  Genitourinary: Vagina normal and uterus normal.  Musculoskeletal: Normal range of motion.  Neurological: She is alert and oriented to person, place, and time. She has normal reflexes.  Skin: Skin is warm and dry.  Psychiatric: She has a normal mood and affect.    Prenatal labs: ABO, Rh: --/--/A POS (06/29 0845) Antibody: NEG (06/29 0845) Rubella: Immune (12/07 0000) RPR: Nonreactive (12/07 0000)  HBsAg: Negative (12/07 0000)  HIV: Non-reactive (12/07 0000)  GBS:     Assessment/Plan: 39 weeks Previous csection for rpt and TL Consent done. Risks vs benefits discussed. Complications discussed.  Vermon Grays J 09/04/2014, 9:17 PM

## 2014-09-04 NOTE — Patient Instructions (Addendum)
   Your procedure is scheduled on: Thursday, June 30  [facility] at: 8:15 AM Pick up the phone at the desk and dial [phone] and inform us of your arrival.  Please call this number if you have any problems the morning of surgery: [phone]  Remember: Do not eat or drink after midnight:  Tonight Take these medicines the morning of surgery with a SIP OF WATER: Synthroid  Do not wear jewelry, make-up, or FINGER nail polish No metal in your hair or on your body. Do not wear lotions, powders, perfumes.  You may wear deodorant.  Do not bring valuables to the hospital.  Leave suitcase in the car. After Surgery it may be brought to your room. For patients being admitted to the hospital, checkout time is 11:00am the day of discharge.  Home with husband Joe cell [phone]

## 2014-09-05 ENCOUNTER — Inpatient Hospital Stay (HOSPITAL_COMMUNITY)
Admission: RE | Admit: 2014-09-05 | Discharge: 2014-09-06 | DRG: 766 | Disposition: A | Discharge status: Home / Self Care | Payer: 59 | Source: Ambulatory Visit | Attending: Obstetrics and Gynecology | Admitting: Obstetrics and Gynecology

## 2014-09-05 ENCOUNTER — Inpatient Hospital Stay (HOSPITAL_COMMUNITY): Payer: 59 | Admitting: Anesthesiology

## 2014-09-05 ENCOUNTER — Encounter (HOSPITAL_COMMUNITY): Payer: Self-pay | Admitting: Emergency Medicine

## 2014-09-05 ENCOUNTER — Encounter (HOSPITAL_COMMUNITY)
Admission: RE | Disposition: A | Discharge status: Home / Self Care | Payer: Self-pay | Source: Ambulatory Visit | Attending: Obstetrics and Gynecology

## 2014-09-05 DIAGNOSIS — Z8249 Family history of ischemic heart disease and other diseases of the circulatory system: Secondary | ICD-10-CM | POA: Diagnosis not present

## 2014-09-05 DIAGNOSIS — L258 Unspecified contact dermatitis due to other agents: Secondary | ICD-10-CM | POA: Diagnosis present

## 2014-09-05 DIAGNOSIS — Z302 Encounter for sterilization: Secondary | ICD-10-CM

## 2014-09-05 DIAGNOSIS — Z833 Family history of diabetes mellitus: Secondary | ICD-10-CM

## 2014-09-05 DIAGNOSIS — E039 Hypothyroidism, unspecified: Secondary | ICD-10-CM | POA: Diagnosis present

## 2014-09-05 DIAGNOSIS — O99284 Endocrine, nutritional and metabolic diseases complicating childbirth: Secondary | ICD-10-CM | POA: Diagnosis present

## 2014-09-05 DIAGNOSIS — Z3A39 39 weeks gestation of pregnancy: Secondary | ICD-10-CM | POA: Diagnosis present

## 2014-09-05 DIAGNOSIS — O3421 Maternal care for scar from previous cesarean delivery: Secondary | ICD-10-CM | POA: Diagnosis present

## 2014-09-05 LAB — RPR: RPR Ser Ql: NONREACTIVE

## 2014-09-05 SURGERY — Surgical Case
Anesthesia: Spinal | Laterality: Bilateral

## 2014-09-05 MED ORDER — BUPIVACAINE HCL (PF) 0.25 % IJ SOLN
INTRAMUSCULAR | Status: AC
Start: 2014-09-05 — End: 2014-09-05
  Filled 2014-09-05: qty 20

## 2014-09-05 MED ORDER — ONDANSETRON HCL 4 MG/2ML IJ SOLN: 4.0000 mg | Freq: Three times a day (TID) | INTRAMUSCULAR | Status: DC | PRN

## 2014-09-05 MED ORDER — NALBUPHINE HCL 10 MG/ML IJ SOLN
5.0000 mg | Freq: Once | INTRAMUSCULAR | Status: AC | PRN
  Administered 2014-09-05: 5 mg via SUBCUTANEOUS

## 2014-09-05 MED ORDER — METHYLERGONOVINE MALEATE 0.2 MG PO TABS: 0.2000 mg | ORAL_TABLET | ORAL | Status: DC | PRN

## 2014-09-05 MED ORDER — SIMETHICONE 80 MG PO CHEW: 80.0000 mg | CHEWABLE_TABLET | ORAL | Status: DC | PRN

## 2014-09-05 MED ORDER — LEVOTHYROXINE SODIUM 112 MCG PO TABS
112.0000 ug | ORAL_TABLET | Freq: Every day | ORAL | Status: DC
  Administered 2014-09-06: 112 ug via ORAL
  Filled 2014-09-05 (×2): qty 1

## 2014-09-05 MED ORDER — SENNOSIDES-DOCUSATE SODIUM 8.6-50 MG PO TABS
2.0000 | ORAL_TABLET | ORAL | Status: DC
  Administered 2014-09-05: 2 via ORAL
  Filled 2014-09-05: qty 2

## 2014-09-05 MED ORDER — FENTANYL CITRATE (PF) 100 MCG/2ML IJ SOLN
INTRAMUSCULAR | Status: AC
  Filled 2014-09-05: qty 2

## 2014-09-05 MED ORDER — PHENYLEPHRINE 8 MG IN D5W 100 ML (0.08MG/ML) PREMIX OPTIME
INJECTION | INTRAVENOUS | Status: DC | PRN
  Administered 2014-09-05: 60 ug/min via INTRAVENOUS

## 2014-09-05 MED ORDER — ACETAMINOPHEN 325 MG PO TABS: 650.0000 mg | ORAL_TABLET | ORAL | Status: DC | PRN

## 2014-09-05 MED ORDER — LANOLIN HYDROUS EX OINT: 1.0000 "application " | TOPICAL_OINTMENT | CUTANEOUS | Status: DC | PRN

## 2014-09-05 MED ORDER — OXYCODONE-ACETAMINOPHEN 5-325 MG PO TABS: 2.0000 | ORAL_TABLET | ORAL | Status: DC | PRN

## 2014-09-05 MED ORDER — WITCH HAZEL-GLYCERIN EX PADS: 1.0000 "application " | MEDICATED_PAD | CUTANEOUS | Status: DC | PRN

## 2014-09-05 MED ORDER — LACTATED RINGERS IV SOLN
Freq: Once | INTRAVENOUS | Status: AC
  Administered 2014-09-05: 09:00:00 via INTRAVENOUS

## 2014-09-05 MED ORDER — KETOROLAC TROMETHAMINE 30 MG/ML IJ SOLN
INTRAMUSCULAR | Status: AC
  Administered 2014-09-05: 30 mg via INTRAMUSCULAR
  Filled 2014-09-05: qty 1

## 2014-09-05 MED ORDER — CEFAZOLIN SODIUM-DEXTROSE 2-3 GM-% IV SOLR
INTRAVENOUS | Status: AC
  Filled 2014-09-05: qty 50

## 2014-09-05 MED ORDER — DIPHENHYDRAMINE HCL 50 MG/ML IJ SOLN
12.5000 mg | INTRAMUSCULAR | Status: DC | PRN
  Administered 2014-09-05: 12.5 mg via INTRAVENOUS

## 2014-09-05 MED ORDER — OXYTOCIN 40 UNITS IN LACTATED RINGERS INFUSION - SIMPLE MED: 62.5000 mL/h | INTRAVENOUS | Status: DC

## 2014-09-05 MED ORDER — NALBUPHINE HCL 10 MG/ML IJ SOLN
5.0000 mg | INTRAMUSCULAR | Status: DC | PRN
  Administered 2014-09-05: 5 mg via INTRAVENOUS
  Filled 2014-09-05: qty 1

## 2014-09-05 MED ORDER — PHENYLEPHRINE 8 MG IN D5W 100 ML (0.08MG/ML) PREMIX OPTIME
INJECTION | INTRAVENOUS | Status: AC
Start: 2014-09-05 — End: 2014-09-05
  Filled 2014-09-05: qty 100

## 2014-09-05 MED ORDER — MENTHOL 3 MG MT LOZG: 1.0000 | LOZENGE | OROMUCOSAL | Status: DC | PRN

## 2014-09-05 MED ORDER — TETANUS-DIPHTH-ACELL PERTUSSIS 5-2.5-18.5 LF-MCG/0.5 IM SUSP: 0.5000 mL | Freq: Once | INTRAMUSCULAR | Status: DC

## 2014-09-05 MED ORDER — LACTATED RINGERS IV SOLN
INTRAVENOUS | Status: DC
  Administered 2014-09-05: 18:00:00 via INTRAVENOUS

## 2014-09-05 MED ORDER — ZOLPIDEM TARTRATE 5 MG PO TABS: 5.0000 mg | ORAL_TABLET | Freq: Every evening | ORAL | Status: DC | PRN

## 2014-09-05 MED ORDER — PRENATAL MULTIVITAMIN CH
1.0000 | ORAL_TABLET | Freq: Every day | ORAL | Status: DC
  Administered 2014-09-06: 1 via ORAL
  Filled 2014-09-05: qty 1

## 2014-09-05 MED ORDER — ACETAMINOPHEN 160 MG/5ML PO SOLN
975.0000 mg | Freq: Once | ORAL | Status: AC
  Administered 2014-09-05: 975 mg via ORAL

## 2014-09-05 MED ORDER — ONDANSETRON HCL 4 MG/2ML IJ SOLN
INTRAMUSCULAR | Status: AC
  Filled 2014-09-05: qty 2

## 2014-09-05 MED ORDER — SODIUM CHLORIDE 0.9 % IJ SOLN: 3.0000 mL | INTRAMUSCULAR | Status: DC | PRN

## 2014-09-05 MED ORDER — FENTANYL CITRATE (PF) 100 MCG/2ML IJ SOLN
INTRAMUSCULAR | Status: DC | PRN
  Administered 2014-09-05: 25 ug via INTRATHECAL

## 2014-09-05 MED ORDER — HYDROMORPHONE HCL 1 MG/ML IJ SOLN: 0.2500 mg | INTRAMUSCULAR | Status: DC | PRN

## 2014-09-05 MED ORDER — KETOROLAC TROMETHAMINE 30 MG/ML IJ SOLN
30.0000 mg | Freq: Once | INTRAMUSCULAR | Status: AC
  Administered 2014-09-05: 30 mg via INTRAMUSCULAR

## 2014-09-05 MED ORDER — IBUPROFEN 600 MG PO TABS
600.0000 mg | ORAL_TABLET | Freq: Four times a day (QID) | ORAL | Status: DC
  Administered 2014-09-05 – 2014-09-06 (×4): 600 mg via ORAL
  Filled 2014-09-05 (×4): qty 1

## 2014-09-05 MED ORDER — MORPHINE SULFATE (PF) 0.5 MG/ML IJ SOLN
INTRAMUSCULAR | Status: DC | PRN
  Administered 2014-09-05: .1 mg via INTRATHECAL

## 2014-09-05 MED ORDER — DIPHENHYDRAMINE HCL 25 MG PO CAPS: 25.0000 mg | ORAL_CAPSULE | ORAL | Status: DC | PRN

## 2014-09-05 MED ORDER — ONDANSETRON HCL 4 MG/2ML IJ SOLN
INTRAMUSCULAR | Status: DC | PRN
  Administered 2014-09-05: 4 mg via INTRAVENOUS

## 2014-09-05 MED ORDER — BUPIVACAINE LIPOSOME 1.3 % IJ SUSP
20.0000 mL | Freq: Once | INTRAMUSCULAR | Status: AC
  Administered 2014-09-05: 20 mL
  Filled 2014-09-05: qty 20

## 2014-09-05 MED ORDER — MEPERIDINE HCL 25 MG/ML IJ SOLN: 6.2500 mg | INTRAMUSCULAR | Status: DC | PRN

## 2014-09-05 MED ORDER — SCOPOLAMINE 1 MG/3DAYS TD PT72
1.0000 | MEDICATED_PATCH | Freq: Once | TRANSDERMAL | Status: DC
  Administered 2014-09-05: 1.5 mg via TRANSDERMAL

## 2014-09-05 MED ORDER — NALBUPHINE HCL 10 MG/ML IJ SOLN: 5.0000 mg | Freq: Once | INTRAMUSCULAR | Status: AC | PRN

## 2014-09-05 MED ORDER — NALOXONE HCL 1 MG/ML IJ SOLN: 1.0000 ug/kg/h | INTRAVENOUS | Status: DC | PRN

## 2014-09-05 MED ORDER — PHENYLEPHRINE HCL 10 MG/ML IJ SOLN
INTRAMUSCULAR | Status: DC | PRN
  Administered 2014-09-05: 160 ug via INTRAVENOUS

## 2014-09-05 MED ORDER — OXYTOCIN 10 UNIT/ML IJ SOLN
INTRAMUSCULAR | Status: AC
Start: 2014-09-05 — End: 2014-09-05
  Filled 2014-09-05: qty 4

## 2014-09-05 MED ORDER — BUPIVACAINE HCL (PF) 0.25 % IJ SOLN
INTRAMUSCULAR | Status: DC | PRN
  Administered 2014-09-05: 20 mL

## 2014-09-05 MED ORDER — DIPHENHYDRAMINE HCL 50 MG/ML IJ SOLN
INTRAMUSCULAR | Status: AC
  Filled 2014-09-05: qty 1

## 2014-09-05 MED ORDER — NALOXONE HCL 0.4 MG/ML IJ SOLN: 0.4000 mg | INTRAMUSCULAR | Status: DC | PRN

## 2014-09-05 MED ORDER — OXYCODONE-ACETAMINOPHEN 5-325 MG PO TABS: 1.0000 | ORAL_TABLET | ORAL | Status: DC | PRN

## 2014-09-05 MED ORDER — ACETAMINOPHEN 160 MG/5ML PO SOLN
ORAL | Status: AC
  Filled 2014-09-05: qty 40.6

## 2014-09-05 MED ORDER — NALBUPHINE HCL 10 MG/ML IJ SOLN
INTRAMUSCULAR | Status: AC
  Administered 2014-09-05: 5 mg via SUBCUTANEOUS
  Filled 2014-09-05: qty 1

## 2014-09-05 MED ORDER — SCOPOLAMINE 1 MG/3DAYS TD PT72
MEDICATED_PATCH | TRANSDERMAL | Status: AC
  Administered 2014-09-05: 1.5 mg via TRANSDERMAL
  Filled 2014-09-05: qty 1

## 2014-09-05 MED ORDER — METHYLERGONOVINE MALEATE 0.2 MG/ML IJ SOLN: 0.2000 mg | INTRAMUSCULAR | Status: DC | PRN

## 2014-09-05 MED ORDER — PHENYLEPHRINE 40 MCG/ML (10ML) SYRINGE FOR IV PUSH (FOR BLOOD PRESSURE SUPPORT)
PREFILLED_SYRINGE | INTRAVENOUS | Status: AC
  Filled 2014-09-05: qty 10

## 2014-09-05 MED ORDER — ACETAMINOPHEN 500 MG PO TABS
1000.0000 mg | ORAL_TABLET | Freq: Four times a day (QID) | ORAL | Status: DC
  Administered 2014-09-05 (×3): 1000 mg via ORAL
  Filled 2014-09-05 (×4): qty 2

## 2014-09-05 MED ORDER — OXYTOCIN 10 UNIT/ML IJ SOLN
40.0000 [IU] | INTRAMUSCULAR | Status: DC | PRN
  Administered 2014-09-05: 40 [IU] via INTRAVENOUS

## 2014-09-05 MED ORDER — SIMETHICONE 80 MG PO CHEW
80.0000 mg | CHEWABLE_TABLET | ORAL | Status: DC
  Administered 2014-09-05: 80 mg via ORAL
  Filled 2014-09-05: qty 1

## 2014-09-05 MED ORDER — CEFAZOLIN SODIUM-DEXTROSE 2-3 GM-% IV SOLR
2.0000 g | INTRAVENOUS | Status: AC
  Administered 2014-09-05: 2 g via INTRAVENOUS

## 2014-09-05 MED ORDER — DIBUCAINE 1 % RE OINT: 1.0000 "application " | TOPICAL_OINTMENT | RECTAL | Status: DC | PRN

## 2014-09-05 MED ORDER — DIPHENHYDRAMINE HCL 25 MG PO CAPS: 25.0000 mg | ORAL_CAPSULE | Freq: Four times a day (QID) | ORAL | Status: DC | PRN

## 2014-09-05 MED ORDER — MORPHINE SULFATE 0.5 MG/ML IJ SOLN
INTRAMUSCULAR | Status: AC
  Filled 2014-09-05: qty 10

## 2014-09-05 MED ORDER — SCOPOLAMINE 1 MG/3DAYS TD PT72
1.0000 | MEDICATED_PATCH | Freq: Once | TRANSDERMAL | Status: DC
  Filled 2014-09-05: qty 1

## 2014-09-05 MED ORDER — NALBUPHINE HCL 10 MG/ML IJ SOLN
5.0000 mg | INTRAMUSCULAR | Status: DC | PRN
  Administered 2014-09-05: 5 mg via SUBCUTANEOUS
  Filled 2014-09-05: qty 1

## 2014-09-05 MED ORDER — SIMETHICONE 80 MG PO CHEW
80.0000 mg | CHEWABLE_TABLET | Freq: Three times a day (TID) | ORAL | Status: DC
  Administered 2014-09-05 – 2014-09-06 (×3): 80 mg via ORAL
  Filled 2014-09-05 (×2): qty 1

## 2014-09-05 MED ORDER — LACTATED RINGERS IV SOLN
INTRAVENOUS | Status: DC
  Administered 2014-09-05 (×2): via INTRAVENOUS

## 2014-09-05 SURGICAL SUPPLY — 34 items
CLAMP CORD UMBIL (MISCELLANEOUS) IMPLANT
CLOTH BEACON ORANGE TIMEOUT ST (SAFETY) ×3 IMPLANT
CONTAINER PREFILL 10% NBF 15ML (MISCELLANEOUS) IMPLANT
DRAPE SHEET LG 3/4 BI-LAMINATE (DRAPES) ×2 IMPLANT
DRSG OPSITE POSTOP 4X10 (GAUZE/BANDAGES/DRESSINGS) ×3 IMPLANT
DURAPREP 26ML APPLICATOR (WOUND CARE) ×3 IMPLANT
ELECT REM PT RETURN 9FT ADLT (ELECTROSURGICAL) ×3
ELECTRODE REM PT RTRN 9FT ADLT (ELECTROSURGICAL) ×1 IMPLANT
EXTRACTOR VACUUM M CUP 4 TUBE (SUCTIONS) IMPLANT
EXTRACTOR VACUUM M CUP 4' TUBE (SUCTIONS)
GLOVE BIO SURGEON STRL SZ7.5 (GLOVE) ×3 IMPLANT
GOWN STRL REUS W/TWL LRG LVL3 (GOWN DISPOSABLE) ×6 IMPLANT
KIT ABG SYR 3ML LUER SLIP (SYRINGE) IMPLANT
NDL HYPO 25X5/8 SAFETYGLIDE (NEEDLE) IMPLANT
NDL SPNL 20GX3.5 QUINCKE YW (NEEDLE) IMPLANT
NEEDLE HYPO 22GX1.5 SAFETY (NEEDLE) ×3 IMPLANT
NEEDLE HYPO 25X5/8 SAFETYGLIDE (NEEDLE) IMPLANT
NEEDLE SPNL 20GX3.5 QUINCKE YW (NEEDLE) IMPLANT
NS IRRIG 1000ML POUR BTL (IV SOLUTION) ×3 IMPLANT
PACK C SECTION WH (CUSTOM PROCEDURE TRAY) ×3 IMPLANT
PAD OB MATERNITY 4.3X12.25 (PERSONAL CARE ITEMS) ×3 IMPLANT
SUT MNCRL 0 VIOLET CTX 36 (SUTURE) ×2 IMPLANT
SUT MNCRL AB 3-0 PS2 27 (SUTURE) IMPLANT
SUT MON AB 2-0 CT1 27 (SUTURE) ×3 IMPLANT
SUT MON AB-0 CT1 36 (SUTURE) ×6 IMPLANT
SUT MONOCRYL 0 CTX 36 (SUTURE) ×4
SUT PLAIN 0 NONE (SUTURE) ×2 IMPLANT
SUT PLAIN 2 0 (SUTURE)
SUT PLAIN 2 0 XLH (SUTURE) IMPLANT
SUT PLAIN ABS 2-0 CT1 27XMFL (SUTURE) IMPLANT
SYR 20CC LL (SYRINGE) IMPLANT
SYR CONTROL 10ML LL (SYRINGE) ×3 IMPLANT
TOWEL OR 17X24 6PK STRL BLUE (TOWEL DISPOSABLE) ×3 IMPLANT
TRAY FOLEY CATH SILVER 14FR (SET/KITS/TRAYS/PACK) ×3 IMPLANT

## 2014-09-05 NOTE — Addendum Note (Signed)
Addendum  created 09/05/14 1544 by Shanon PayorSuzanne M Rawlin Reaume, CRNA   Modules edited: Notes Section   Notes Section:  File: [id number]

## 2014-09-05 NOTE — Anesthesia Postprocedure Evaluation (Signed)
  Anesthesia Post-op Note  Patient: Crystal HedgesJennifer B Sanchez  Procedure(s) Performed: Procedure(s) with comments: REPEAT CESAREAN SECTION WITH BILATERAL TUBAL LIGATION  (Bilateral) - EDD: 09/12/14  Patient is awake, responsive, moving her legs, and has signs of resolution of her numbness. Pain and nausea are reasonably well controlled. Vital signs are stable and clinically acceptable. Oxygen saturation is clinically acceptable. There are no apparent anesthetic complications at this time. Patient is ready for discharge.

## 2014-09-05 NOTE — Anesthesia Postprocedure Evaluation (Signed)
  Anesthesia Post-op Note  Patient: Crystal HedgesJennifer B Sanchez  Procedure(s) Performed: Procedure(s) with comments: REPEAT CESAREAN SECTION WITH BILATERAL TUBAL LIGATION  (Bilateral) - EDD: 09/12/14  Patient Location: Mother/Baby  Anesthesia Type:Spinal  Level of Consciousness: awake, alert  and oriented  Airway and Oxygen Therapy: Patient Spontanous Breathing  Post-op Pain: none  Post-op Assessment: Post-op Vital signs reviewed, Patient's Cardiovascular Status Stable, Respiratory Function Stable, No signs of Nausea or vomiting, Pain level controlled, No headache and No backache      Post-op Vital Signs: Reviewed and stable  Last Vitals:  Filed Vitals:   09/05/14 1330  BP: 97/61  Pulse: 58  Temp: 36.5 C  Resp: 18    Complications: No apparent anesthesia complications

## 2014-09-05 NOTE — Anesthesia Procedure Notes (Signed)

## 2014-09-05 NOTE — Anesthesia Preprocedure Evaluation (Signed)

## 2014-09-05 NOTE — Transfer of Care (Signed)
Immediate Anesthesia Transfer of Care Note  Patient: Aleila Syverson HedgesJennifer B Kuhnle  Procedure(s) Performed: Procedure(s) with comments: REPEAT CESAREAN SECTION WITH BILATERAL TUBAL LIGATION  (Bilateral) - EDD: 09/12/14  Patient Location: PACU  Anesthesia Type:Spinal  Level of Consciousness: awake  Airway & Oxygen Therapy: Patient Spontanous Breathing  Post-op Assessment: Report given to RN  Post vital signs: Reviewed and stable  Last Vitals:  Filed Vitals:   09/05/14 0817  BP: 108/81  Pulse: 77  Temp: 36.5 C  Resp: 16    Complications: No apparent anesthesia complications

## 2014-09-05 NOTE — Progress Notes (Signed)
Patient ID: Crystal HedgesJennifer B Sanchez, female   DOB: Sep 17, 1980, [age] y.o.   MRN: [id number] Patient seen and examined. Consent witnessed and signed. No changes noted. Update completed.

## 2014-09-05 NOTE — Progress Notes (Signed)
Patient ID: Crystal HedgesJennifer B Prange, female   DOB: [birth year], 34 y.o.   MRN: [id number] INTERVAL NOTE: S/P Repeat Cesarean Delivery with BTL  S:   Sitting in recliner, BFing, min cramping, (+) voids, small bleed, denies HA/NV/dizziness  O:   VSS, AAO x 3, NAD  U- even  Scant lochia  A / P:   PPD #0 / [id number] / S/P Repeat Cesarean delivery with BTL  Stable post partum  Routine PP orders  Kenard GowerAWSON, Wanona Stare, M, MSN, CNM 09/05/2014, 5:59 PM

## 2014-09-05 NOTE — Op Note (Signed)
Cesarean Section Procedure Note  Indications: previous uterine incision kerr x2  Pre-operative Diagnosis: 39 week 1 day pregnancy.  Post-operative Diagnosis: same  Surgeon: Lenoard AdenAAVON,Graycen Degan J   Assistants: Montez HagemanBailey, FACM, CNM  Anesthesia: Local anesthesia 0.25.% bupivacaine and Spinal anesthesia  ASA Class: 2  Procedure Details  The patient was seen in the Holding Room. The risks, benefits, complications, treatment options, and expected outcomes were discussed with the patient.  The patient concurred with the proposed plan, giving informed consent. The risks of anesthesia, infection, bleeding and possible injury to other organs discussed. Injury to bowel, bladder, or ureter with possible need for repair discussed. Possible need for transfusion with secondary risks of hepatitis or HIV acquisition discussed. Post operative complications to include but not limited to DVT, PE and Pneumonia noted. The site of surgery properly noted/marked. The patient was taken to Operating Room # 9, identified as Crystal Sanchez and the procedure verified as C-Section Delivery. A Time Out was held and the above information confirmed.  After induction of anesthesia, the patient was draped and prepped in the usual sterile manner. A Pfannenstiel incision was made and carried down through the subcutaneous tissue to the fascia. Fascial incision was made and extended transversely using Mayo scissors. The fascia was separated from the underlying rectus tissue superiorly and inferiorly. The peritoneum was identified and entered. Peritoneal incision was extended longitudinally. The utero-vesical peritoneal reflection was incised transversely and the bladder flap was bluntly freed from the lower uterine segment. A low transverse uterine incision(Kerr hysterotomy) was made. Delivered from OT presentation was a  female with Apgar scores of 9 at one minute and 9 at five minutes. Bulb suctioning gently performed. Neonatal team in  attendance.After the umbilical cord was clamped and cut cord blood was obtained for evaluation. The placenta was removed intact and appeared normal. The uterus was curetted with a dry lap pack. Good hemostasis was noted.The uterine outline, tubes and ovaries appeared normal. Bilateral partial salpingectomy with modified Pomeroy method.The uterine incision was closed with running locked sutures of 0 Monocryl x 1 layers. Hemostasis was observed. Lavage was carried out until clear.The lower rectus abdominis closed with interrupted 0 Monocryl suture. The fascia was then reapproximated with running sutures of 0 Monocryl. The skin was reapproximated with 3-0 monocryl after Corinth closure with 2-0 plain.  Instrument, sponge, and needle counts were correct prior the abdominal closure and at the conclusion of the case.   Findings: FTLF, OT, 9/9, nl tubes , nl ovaries  Estimated Blood Loss:  600         Drains: foley                 Specimens: placenta                 Complications:  None; patient tolerated the procedure well.         Disposition: PACU - hemodynamically stable.         Condition: stable  Attending Attestation: I performed the procedure.

## 2014-09-06 LAB — CBC
HEMATOCRIT: 38.7 % (ref 36.0–46.0)
Hemoglobin: 13.1 g/dL (ref 12.0–15.0)
MCH: 33.1 pg (ref 26.0–34.0)
MCHC: 33.9 g/dL (ref 30.0–36.0)
MCV: 97.7 fL (ref 78.0–100.0)
Platelets: 146 10*3/uL — ABNORMAL LOW (ref 150–400)
RBC: 3.96 MIL/uL (ref 3.87–5.11)
RDW: 13.4 % (ref 11.5–15.5)
WBC: 11.3 10*3/uL — ABNORMAL HIGH (ref 4.0–10.5)

## 2014-09-06 LAB — BIRTH TISSUE RECOVERY COLLECTION (PLACENTA DONATION)

## 2014-09-06 MED ORDER — HYDROCORTISONE 1 % EX CREA
TOPICAL_CREAM | Freq: Two times a day (BID) | CUTANEOUS | Status: DC
  Administered 2014-09-06: 12:00:00 via TOPICAL
  Filled 2014-09-06: qty 28

## 2014-09-06 MED ORDER — IBUPROFEN 600 MG PO TABS: 600.0000 mg | ORAL_TABLET | Freq: Four times a day (QID) | ORAL | Status: AC

## 2014-09-06 NOTE — Discharge Summary (Signed)
POSTOPERATIVE DISCHARGE SUMMARY:  Patient ID: Crystal HedgesJennifer B Sanchez MRN: [id number] DOB/AGE: 35-May-1982 35 y.o.  Admit date: 09/05/2014 Admission Diagnoses: 39 weeks / previous cesarean section / undesired fertility  Discharge date:  09/06/2014 Discharge Diagnoses: POD 1.5 s/p repeat CS and bilateral tubal sterilization  Prenatal history: [id number]   EDC : 09/11/2014, by Last Menstrual Period  [facility]  Primary provider :Kathi LudwigBailey CNM Prenatal course complicated by hypothyroidism / previous CS for cord prolapse  Prenatal Labs: ABO, Rh: --/--/A POS (06/29 0845)  Antibody: NEG (06/29 0845) Rubella: Immune (12/07 0000)  RPR: Non Reactive (06/29 0845)  HBsAg: Negative (12/07 0000)  HIV: Non-reactive (12/07 0000)  GTT : NL   Medical / Surgical History :  Past medical history:  Past Medical History  Diagnosis Date  . H/O varicella   . Contact with or exposure to other viral diseases(V01.79)   . Hypothyroidism   . SVD (spontaneous vaginal delivery)     x 1  . PONV (postoperative nausea and vomiting)   . Missed abortion     x 2 - no surgery required  . Postpartum care following cesarean delivery (6/30) 09/05/2014    Past surgical history:  Past Surgical History  Procedure Laterality Date  . Wisdom tooth extraction    . Cesarean section  08/27/2011    Procedure: CESAREAN SECTION;  Surgeon: Robley FriesVaishali R Mody, MD;  Location: [facility];  Service: Gynecology;  Laterality: N/A;    Family History:  Family History  Problem Relation Age of Onset  . Hyperlipidemia Mother   . Meniere's disease Mother   . Hyperlipidemia Father   . Hypertension Maternal Grandmother   . Heart attack Maternal Grandmother   . Diabetes Maternal Grandmother   . Heart disease Maternal Grandmother   . Hypertension Maternal Grandfather   . Heart attack Maternal Grandfather   . Diabetes Maternal Grandfather   . Heart disease Maternal Grandfather   . Cancer Maternal Grandfather      brain  . Heart disease Paternal Grandfather   . Hypertension Paternal Grandfather     Social History:  reports that she has never smoked. She has never used smokeless tobacco. She reports that she does not drink alcohol or use illicit drugs.  Allergies: Review of patient's allergies indicates no known allergies.   Current Medications at time of admission:  Prior to Admission medications   Medication Sig Start Date End Date Taking? Authorizing Provider  acetaminophen (TYLENOL) 500 MG tablet Take 1,000 mg by mouth every 6 (six) hours as needed for mild pain or headache.   Yes Historical Provider, MD  Calcium Carb-Cholecalciferol (CALCIUM + D3 PO) Take 1 tablet by mouth daily.   Yes Historical Provider, MD  levothyroxine (SYNTHROID, LEVOTHROID) 112 MCG tablet Take 112 mcg by mouth daily before breakfast.   Yes Historical Provider, MD  docusate sodium (COLACE) 100 MG capsule Take 200 mg by mouth daily as needed. For constipation    Historical Provider, MD  ibuprofen (ADVIL,MOTRIN) 600 MG tablet Take 1 tablet (600 mg total) by mouth every 6 (six) hours. 09/06/14   Marlinda Mikeanya Tage Feggins, CNM  Prenatal Vit-Fe Fumarate-FA (PRENATAL MULTIVITAMIN) TABS Take 1 tablet by mouth daily.    Historical Provider, MD    Procedures: Cesarean section delivery on 09/05/2014 with delivery of viable female newborn by Dr Billy Coastaavon   See operative report for further details APGAR (1 MIN): 9   APGAR (5 MINS): 9    Postoperative / postpartum course:  Uncomplicated  with discharge on POD 1.5  Discharge Instructions:  Discharged Condition: stable  Activity: pelvic rest and postoperative restrictions x 2   Diet: routine  Medications:    Medication List    TAKE these medications        acetaminophen 500 MG tablet  Commonly known as:  TYLENOL  Take 1,000 mg by mouth every 6 (six) hours as needed for mild pain or headache.     CALCIUM + D3 PO  Take 1 tablet by mouth daily.     docusate sodium 100 MG capsule   Commonly known as:  COLACE  Take 200 mg by mouth daily as needed. For constipation     ibuprofen 600 MG tablet  Commonly known as:  ADVIL,MOTRIN  Take 1 tablet (600 mg total) by mouth every 6 (six) hours.     levothyroxine 112 MCG tablet  Commonly known as:  SYNTHROID, LEVOTHROID  Take 112 mcg by mouth daily before breakfast.     prenatal multivitamin Tabs tablet  Take 1 tablet by mouth daily.        Wound Care: keep clean and dry / remove honeycomb POD 4 Postpartum Instructions: [facility] discharge booklet - instructions reviewed  Discharge to: Home  Follow up :   [facility] in 6 weeks for routine postpartum visit with Marlinda Mike CNM                Signed: Marlinda Mike CNM, MSN, [facility] 09/06/2014, 12:25 PM

## 2014-09-06 NOTE — Progress Notes (Signed)
POSTOPERATIVE DAY # 1 S/P repeat CS and BTL   S:         Reports feeling well             Tolerating po intake / no nausea / no vomiting / + flatus / no BM             Bleeding is light             Pain controlled with motrin only - reports little to no pain since surgery             Up ad lib / ambulatory/ voiding QS  Newborn breast feeding  O:  VS: BP 94/59 mmHg  Pulse 59  Temp(Src) 98.7 F (37.1 C) (Oral)  Resp 18  SpO2 97%  LMP 12/05/2013  Breastfeeding? Unknown   LABS:               Recent Labs  09/04/14 0845  WBC 10.9*  HGB 14.5  PLT 146*               Bloodtype: --/--/A POS (06/29 0845)  Rubella: Immune (12/07 0000)                                             I&O: Intake/Output      06/30 0701 - 07/01 0700 07/01 0701 - 07/02 0700   P.O. 360    I.V. 2944.8    Total Intake 3304.8     Urine 1675    Blood 600    Total Output 2275     Net +1029.8          Urine Occurrence 2 x                 Physical Exam:             Alert and Oriented X3  Lungs: Clear and unlabored  Heart: regular rate and rhythm / no mumurs  Abdomen: soft, non-tender, non-distended, active BS, erythema in shape of OR drape over abdomen - reports itching to area             Fundus: firm, non-tender, U-1             Dressing intact honeycomb              Incision:  approximated with suture / no erythema / small ecchymosis  / no drainage  Perineum: intact  Lochia: light  Extremities: no edema, no calf pain or tenderness, negative Homans  A:        POD # 1 S/P CS and BTL            Contact dermatitis - abdominal prep and OR drape reaction  P:        Routine postoperative care              Hydrocortisone cream to rash - shower to remove any residual prep & adhesive from abdomen             DC tomorrow   Marlinda MikeBAILEY, TANYA CNM, MSN, [facility] 09/06/2014, 9:49 AM

## 2014-09-09 ENCOUNTER — Encounter (HOSPITAL_COMMUNITY): Payer: Self-pay | Admitting: Obstetrics and Gynecology

## 2014-12-17 ENCOUNTER — Encounter (HOSPITAL_BASED_OUTPATIENT_CLINIC_OR_DEPARTMENT_OTHER): Payer: Self-pay | Admitting: *Deleted

## 2014-12-17 ENCOUNTER — Emergency Department (HOSPITAL_BASED_OUTPATIENT_CLINIC_OR_DEPARTMENT_OTHER)
Admission: EM | Admit: 2014-12-17 | Discharge: 2014-12-17 | Disposition: A | Discharge status: Home / Self Care | Payer: 59 | Attending: Emergency Medicine | Admitting: Emergency Medicine

## 2014-12-17 DIAGNOSIS — R5383 Other fatigue: Secondary | ICD-10-CM | POA: Insufficient documentation

## 2014-12-17 DIAGNOSIS — E039 Hypothyroidism, unspecified: Secondary | ICD-10-CM | POA: Diagnosis not present

## 2014-12-17 DIAGNOSIS — Z79899 Other long term (current) drug therapy: Secondary | ICD-10-CM | POA: Diagnosis not present

## 2014-12-17 DIAGNOSIS — Z8619 Personal history of other infectious and parasitic diseases: Secondary | ICD-10-CM | POA: Diagnosis not present

## 2014-12-17 DIAGNOSIS — R002 Palpitations: Secondary | ICD-10-CM | POA: Insufficient documentation

## 2014-12-17 LAB — BASIC METABOLIC PANEL
ANION GAP: 4 — AB (ref 5–15)
BUN: 15 mg/dL (ref 6–20)
CHLORIDE: 109 mmol/L (ref 101–111)
CO2: 27 mmol/L (ref 22–32)
Calcium: 9.1 mg/dL (ref 8.9–10.3)
Creatinine, Ser: 0.72 mg/dL (ref 0.44–1.00)
GFR calc Af Amer: >60 mL/min (ref >60)
GFR calc non Af Amer: >60 mL/min (ref >60)
GLUCOSE: 105 mg/dL — AB (ref 65–99)
Potassium: 3.7 mmol/L (ref 3.5–5.1)
Sodium: 140 mmol/L (ref 135–145)

## 2014-12-17 LAB — CBC WITH DIFFERENTIAL/PLATELET
Basophils Absolute: 0 10*3/uL (ref 0.0–0.1)
Basophils Relative: 0 %
Eosinophils Absolute: 0.2 10*3/uL (ref 0.0–0.7)
Eosinophils Relative: 2 %
HEMATOCRIT: 41 % (ref 36.0–46.0)
HEMOGLOBIN: 14.3 g/dL (ref 12.0–15.0)
LYMPHS PCT: 26 %
Lymphs Abs: 2.1 10*3/uL (ref 0.7–4.0)
MCH: 31.1 pg (ref 26.0–34.0)
MCHC: 34.9 g/dL (ref 30.0–36.0)
MCV: 89.1 fL (ref 78.0–100.0)
MONO ABS: 0.6 10*3/uL (ref 0.1–1.0)
MONOS PCT: 7 %
NEUTROS ABS: 5 10*3/uL (ref 1.7–7.7)
Neutrophils Relative %: 65 %
Platelets: 222 10*3/uL (ref 150–400)
RBC: 4.6 MIL/uL (ref 3.87–5.11)
RDW: 11.7 % (ref 11.5–15.5)
WBC: 7.9 10*3/uL (ref 4.0–10.5)

## 2014-12-17 NOTE — ED Provider Notes (Signed)
CSN: [id number]     Arrival date & time 12/17/14  2045 History  By signing my name below, I, Lyndel Safe, attest that this documentation has been prepared under the direction and in the presence of Geoffery Lyons, MD. Electronically Signed: Lyndel Safe, ED Scribe. 12/17/2014. 9:22 PM.   Chief Complaint  Patient presents with  . Palpitations   The history is provided by the patient. No language interpreter was used.   HPI Comments: Crystal Sanchez is a 34 y.o. female, with a PMhx of hypothyroidism, who presents to the Emergency Department complaining of sudden onset, intermittent palpitations. Pt reports she felt fatigued throughout the day and she checked her pulse this evening and found it to be irregular. She denies any pain or past similar experiences. She notes her endocrinologist called today and decreased her synthroid dose from to but she has not implemented this change as of yet. She reports drinking approximately 3 cups of coffee each morning. Pt gave birth 3 months ago and is breast feeding and has a second child at home. She is otherwise well. Denies BLE edema. PCP appointment scheduled for tomorrow afternoon.   Past Medical History  Diagnosis Date  . H/O varicella   . Contact with or exposure to other viral diseases(V01.79)   . Hypothyroidism   . SVD (spontaneous vaginal delivery)     x 1  . PONV (postoperative nausea and vomiting)   . Missed abortion     x 2 - no surgery required  . Postpartum care following cesarean delivery (6/30) 09/05/2014   Past Surgical History  Procedure Laterality Date  . Wisdom tooth extraction    . Cesarean section  08/27/2011    Procedure: CESAREAN SECTION;  Surgeon: Robley Fries, MD;  Location: [facility];  Service: Gynecology;  Laterality: N/A;  . Cesarean section with bilateral tubal ligation Bilateral 09/05/2014    Procedure: REPEAT CESAREAN SECTION WITH BILATERAL TUBAL LIGATION ;  Surgeon: Olivia Mackie, MD;  Location: [facility]  [facility];  Service: Obstetrics;  Laterality: Bilateral;  EDD: 09/12/14   Family History  Problem Relation Age of Onset  . Hyperlipidemia Mother   . Meniere's disease Mother   . Hyperlipidemia Father   . Hypertension Maternal Grandmother   . Heart attack Maternal Grandmother   . Diabetes Maternal Grandmother   . Heart disease Maternal Grandmother   . Hypertension Maternal Grandfather   . Heart attack Maternal Grandfather   . Diabetes Maternal Grandfather   . Heart disease Maternal Grandfather   . Cancer Maternal Grandfather     brain  . Heart disease Paternal Grandfather   . Hypertension Paternal Grandfather    Social History  Substance Use Topics  . Smoking status: Never Smoker   . Smokeless tobacco: Never Used  . Alcohol Use: No   OB History    Gravida Para Term Preterm AB TAB SAB Ectopic Multiple Living   0 3     Review of Systems  Constitutional: Positive for fatigue.  Cardiovascular: Positive for palpitations. Negative for leg swelling.  Musculoskeletal: Negative for arthralgias.  A complete 10 system review of systems was obtained and is otherwise negative except at noted in the HPI and PMH. Allergies  Review of patient's allergies indicates no known allergies.  Home Medications   Prior to Admission medications   Medication Sig Start Date End Date Taking? Authorizing Provider  acetaminophen (TYLENOL) 500 MG tablet Take 1,000 mg by mouth every  6 (six) hours as needed for mild pain or headache.    Historical Provider, MD  Calcium Carb-Cholecalciferol (CALCIUM + D3 PO) Take 1 tablet by mouth daily.    Historical Provider, MD  docusate sodium (COLACE) 100 MG capsule Take 200 mg by mouth daily as needed. For constipation    Historical Provider, MD  ibuprofen (ADVIL,MOTRIN) 600 MG tablet Take 1 tablet (600 mg total) by mouth every 6 (six) hours. 09/06/14   Marlinda Mike, CNM  levothyroxine (SYNTHROID, LEVOTHROID) 112 MCG tablet Take 112 mcg by mouth daily before  breakfast.    Historical Provider, MD  Prenatal Vit-Fe Fumarate-FA (PRENATAL MULTIVITAMIN) TABS Take 1 tablet by mouth daily.    Historical Provider, MD   BP 136/74 mmHg  Pulse 88  Temp(Src) 98 F (36.7 C) (Oral)  Resp 20  Ht 5' 2.75" (1.594 m)  Wt 160 lb (72.576 kg)  BMI 28.56 kg/m2  SpO2 98% Physical Exam  Constitutional: She is oriented to person, place, and time. She appears well-developed and well-nourished.  HENT:  Head: Normocephalic and atraumatic.  Eyes: EOM are normal. Pupils are equal, round, and reactive to light.  Neck: Neck supple.  Cardiovascular: Normal rate, regular rhythm and normal heart sounds.   No murmur heard. Pulmonary/Chest: Effort normal and breath sounds normal. No respiratory distress. She has no wheezes.  Abdominal: Soft. There is no tenderness.  Musculoskeletal: Normal range of motion. She exhibits no edema.  Neurological: She is alert and oriented to person, place, and time. No cranial nerve deficit.  Skin: Skin is warm and dry.  Psychiatric: She has a normal mood and affect.  Nursing note and vitals reviewed.   ED Course  Procedures  DIAGNOSTIC STUDIES: Oxygen Saturation is 98% on RA, normal by my interpretation.    COORDINATION OF CARE: 9:21 PM Discussed treatment plan with pt at bedside and pt agreed to plan.   Labs Review Labs Reviewed  BASIC METABOLIC PANEL - Abnormal; Notable for the following:    Glucose, Bld 105 (*)    Anion gap 4 (*)    All other components within normal limits  CBC WITH DIFFERENTIAL/PLATELET  I have personally reviewed and evaluated these lab results as part of my medical decision-making.   EKG Interpretation   Date/Time:  Tuesday December 17 2014 20:59:43 EDT Ventricular Rate:  65 PR Interval:  146 QRS Duration: 82 QT Interval:  388 QTC Calculation: 403 R Axis:   58 Text Interpretation:  Sinus rhythm with marked sinus arrhythmia Otherwise  normal ECG Confirmed by Athol Bolds  MD, Daena Alper ([id number]) on 12/17/2014  9:34:57 PM      MDM   Final diagnoses:  None    Patient presents with complaints of palpitations. She feels as though her heart intermittently skips a beat. She noticed this earlier today. She denies any chest pain or difficulty breathing. While in the emergency department, her EKG reveals a sinus arrhythmia and I have witnessed several PVCs. Her electrolytes showed no abnormalities in blood counts are normal. She does drink some caffeine daily and this may well be the cause. She will be discharged with instructions to follow-up with her primary Dr. if not improving.  Roney Jaffe, personally performed the services described in this documentation. All medical record entries made by the scribe were at my direction and in my presence.  I have reviewed the chart and discharge instructions and agree that the record reflects my personal performance and is accurate and complete. Geoffery Lyons.  12/17/2014. 10:03  PM.       Geoffery Lyons, MD 12/17/14 2204

## 2014-12-17 NOTE — ED Notes (Signed)
Palpitations this evening. She feels tired. States she feels like her heart is skipping beats.

## 2014-12-17 NOTE — ED Notes (Signed)
MD at bedside. 

## 2014-12-17 NOTE — ED Notes (Signed)
Pt verbalizes understanding of d/c instructions and denies any further needs at this time. 

## 2014-12-17 NOTE — ED Notes (Signed)
Pt states she has not been feeling well all day, more tired than usual.  Something made her check her pulse and she states it seemed as if she was "dropping a beat," and began feeling intermittent palpitations.  Pt denies chest pain, sob, dizziness or any other symptoms associated with the palpitations and has never had this feeling in the past.  She has not increased her amount of caffeine, has about 4 cups per day, and states she ate and drank fluids as normal.  She had her thyroid checked today and her levels were normal,  TSH 0.007 Free T4 0.89

## 2014-12-17 NOTE — Discharge Instructions (Signed)
Follow-up with your primary Dr. if not improving, and return to the ER symptoms significantly worsen or change.   Palpitations A palpitation is the feeling that your heartbeat is irregular or is faster than normal. It may feel like your heart is fluttering or skipping a beat. Palpitations are usually not a serious problem. However, in some cases, you may need further medical evaluation. CAUSES  Palpitations can be caused by:  Smoking.  Caffeine or other stimulants, such as diet pills or energy drinks.  Alcohol.  Stress and anxiety.  Strenuous physical activity.  Fatigue.  Certain medicines.  Heart disease, especially if you have a history of irregular heart rhythms (arrhythmias), such as atrial fibrillation, atrial flutter, or supraventricular tachycardia.  An improperly working pacemaker or defibrillator. DIAGNOSIS  To find the cause of your palpitations, your health care provider will take your medical history and perform a physical exam. Your health care provider may also have you take a test called an ambulatory electrocardiogram (ECG). An ECG records your heartbeat patterns over a 24-hour period. You may also have other tests, such as:  Transthoracic echocardiogram (TTE). During echocardiography, sound waves are used to evaluate how blood flows through your heart.  Transesophageal echocardiogram (TEE).  Cardiac monitoring. This allows your health care provider to monitor your heart rate and rhythm in real time.  Holter monitor. This is a portable device that records your heartbeat and can help diagnose heart arrhythmias. It allows your health care provider to track your heart activity for several days, if needed.  Stress tests by exercise or by giving medicine that makes the heart beat faster. TREATMENT  Treatment of palpitations depends on the cause of your symptoms and can vary greatly. Most cases of palpitations do not require any treatment other than time, relaxation,  and monitoring your symptoms. Other causes, such as atrial fibrillation, atrial flutter, or supraventricular tachycardia, usually require further treatment. HOME CARE INSTRUCTIONS   Avoid:  Caffeinated coffee, tea, soft drinks, diet pills, and energy drinks.  Chocolate.  Alcohol.  Stop smoking if you smoke.  Reduce your stress and anxiety. Things that can help you relax include:  A method of controlling things in your body, such as your heartbeats, with your mind (biofeedback).  Yoga.  Meditation.  Physical activity such as swimming, jogging, or walking.  Get plenty of rest and sleep. SEEK MEDICAL CARE IF:   You continue to have a fast or irregular heartbeat beyond 24 hours.  Your palpitations occur more often. SEEK IMMEDIATE MEDICAL CARE IF:  You have chest pain or shortness of breath.  You have a severe headache.  You feel dizzy or you faint. MAKE SURE YOU:  Understand these instructions.  Will watch your condition.  Will get help right away if you are not doing well or get worse.   This information is not intended to replace advice given to you by your health care provider. Make sure you discuss any questions you have with your health care provider.   Document Released: 02/20/2000 Document Revised: 02/27/2013 Document Reviewed: 04/23/2011 Elsevier Interactive Patient Education [employer]

## 2015-06-29 ENCOUNTER — Emergency Department (HOSPITAL_COMMUNITY): Payer: 59

## 2015-06-29 ENCOUNTER — Emergency Department (HOSPITAL_COMMUNITY)
Admission: EM | Admit: 2015-06-29 | Discharge: 2015-06-29 | Disposition: A | Discharge status: Home / Self Care | Payer: 59 | Attending: Emergency Medicine | Admitting: Emergency Medicine

## 2015-06-29 ENCOUNTER — Encounter (HOSPITAL_COMMUNITY): Payer: Self-pay | Admitting: Emergency Medicine

## 2015-06-29 DIAGNOSIS — E039 Hypothyroidism, unspecified: Secondary | ICD-10-CM | POA: Insufficient documentation

## 2015-06-29 DIAGNOSIS — Z79899 Other long term (current) drug therapy: Secondary | ICD-10-CM | POA: Insufficient documentation

## 2015-06-29 DIAGNOSIS — R0600 Dyspnea, unspecified: Secondary | ICD-10-CM | POA: Insufficient documentation

## 2015-06-29 DIAGNOSIS — Z8619 Personal history of other infectious and parasitic diseases: Secondary | ICD-10-CM | POA: Insufficient documentation

## 2015-06-29 DIAGNOSIS — R110 Nausea: Secondary | ICD-10-CM | POA: Insufficient documentation

## 2015-06-29 DIAGNOSIS — Z3202 Encounter for pregnancy test, result negative: Secondary | ICD-10-CM | POA: Diagnosis not present

## 2015-06-29 DIAGNOSIS — R0602 Shortness of breath: Secondary | ICD-10-CM | POA: Diagnosis present

## 2015-06-29 LAB — COMPREHENSIVE METABOLIC PANEL
ALK PHOS: 45 U/L (ref 38–126)
ALT: 13 U/L — AB (ref 14–54)
AST: 14 U/L — AB (ref 15–41)
Albumin: 3.8 g/dL (ref 3.5–5.0)
Anion gap: 11 (ref 5–15)
BILIRUBIN TOTAL: 1.3 mg/dL — AB (ref 0.3–1.2)
BUN: 15 mg/dL (ref 6–20)
CALCIUM: 8.6 mg/dL — AB (ref 8.9–10.3)
CO2: 23 mmol/L (ref 22–32)
CREATININE: 0.7 mg/dL (ref 0.44–1.00)
Chloride: 103 mmol/L (ref 101–111)
GFR calc Af Amer: >60 mL/min (ref >60)
GLUCOSE: 133 mg/dL — AB (ref 65–99)
Potassium: 3.6 mmol/L (ref 3.5–5.1)
Sodium: 137 mmol/L (ref 135–145)
TOTAL PROTEIN: 6.7 g/dL (ref 6.5–8.1)

## 2015-06-29 LAB — POC URINE PREG, ED: Preg Test, Ur: NEGATIVE

## 2015-06-29 LAB — URINALYSIS, ROUTINE W REFLEX MICROSCOPIC
BILIRUBIN URINE: NEGATIVE
Glucose, UA: NEGATIVE mg/dL
Hgb urine dipstick: NEGATIVE
Ketones, ur: NEGATIVE mg/dL
LEUKOCYTES UA: NEGATIVE
NITRITE: NEGATIVE
PROTEIN: NEGATIVE mg/dL
Specific Gravity, Urine: 1.041 — ABNORMAL HIGH (ref 1.005–1.030)
pH: 6.5 (ref 5.0–8.0)

## 2015-06-29 LAB — LIPASE, BLOOD: Lipase: 34 U/L (ref 11–51)

## 2015-06-29 LAB — CBC
HCT: 43.5 % (ref 36.0–46.0)
Hemoglobin: 15 g/dL (ref 12.0–15.0)
MCH: 31.7 pg (ref 26.0–34.0)
MCHC: 34.5 g/dL (ref 30.0–36.0)
MCV: 92 fL (ref 78.0–100.0)
PLATELETS: 191 10*3/uL (ref 150–400)
RBC: 4.73 MIL/uL (ref 3.87–5.11)
RDW: 12.1 % (ref 11.5–15.5)
WBC: 12.8 10*3/uL — AB (ref 4.0–10.5)

## 2015-06-29 MED ORDER — ONDANSETRON 4 MG PO TBDP
8.0000 mg | ORAL_TABLET | Freq: Once | ORAL | Status: AC
  Administered 2015-06-29: 8 mg via ORAL
  Filled 2015-06-29: qty 2

## 2015-06-29 MED ORDER — ONDANSETRON HCL 4 MG PO TABS: 4.0000 mg | ORAL_TABLET | Freq: Four times a day (QID) | ORAL | Status: AC

## 2015-06-29 NOTE — ED Provider Notes (Signed)
CSN: [id number]     Arrival date & time 06/29/15  0012 History   First MD Initiated Contact with Patient 06/29/15 0448     Chief Complaint  Patient presents with  . Shortness of Breath  . Nausea     (Consider location/radiation/quality/duration/timing/severity/associated sxs/prior Treatment) Patient is a 35 y.o. female presenting with shortness of breath. The history is provided by the patient.  Shortness of Breath She started having nausea yesterday morning. History evening, she started developing dyspnea. She denies cough, fever, chills. She denies chest pain. There's been no abdominal pain, constipation, diarrhea. Her children have been sick with diarrhea but she has not been in contact with anyone with nausea or dyspnea. Nausea is still present, but her dyspnea has completely resolved. She is a nonsmoker. She is breast-feeding.  Past Medical History  Diagnosis Date  . H/O varicella   . Contact with or exposure to other viral diseases(V01.79)   . Hypothyroidism   . SVD (spontaneous vaginal delivery)     x 1  . PONV (postoperative nausea and vomiting)   . Missed abortion     x 2 - no surgery required  . Postpartum care following cesarean delivery (6/30) 09/05/2014   Past Surgical History  Procedure Laterality Date  . Wisdom tooth extraction    . Cesarean section  08/27/2011    Procedure: CESAREAN SECTION;  Surgeon: Robley Fries, MD;  Location: [facility];  Service: Gynecology;  Laterality: N/A;  . Cesarean section with bilateral tubal ligation Bilateral 09/05/2014    Procedure: REPEAT CESAREAN SECTION WITH BILATERAL TUBAL LIGATION ;  Surgeon: Olivia Mackie, MD;  Location: [facility];  Service: Obstetrics;  Laterality: Bilateral;  EDD: 09/12/14   Family History  Problem Relation Age of Onset  . Hyperlipidemia Mother   . Meniere's disease Mother   . Hyperlipidemia Father   . Hypertension Maternal Grandmother   . Heart attack Maternal Grandmother   . Diabetes Maternal Grandmother   .  Heart disease Maternal Grandmother   . Hypertension Maternal Grandfather   . Heart attack Maternal Grandfather   . Diabetes Maternal Grandfather   . Heart disease Maternal Grandfather   . Cancer Maternal Grandfather     brain  . Heart disease Paternal Grandfather   . Hypertension Paternal Grandfather    Social History  Substance Use Topics  . Smoking status: Never Smoker   . Smokeless tobacco: Never Used  . Alcohol Use: No   OB History    Gravida Para Term Preterm AB TAB SAB Ectopic Multiple Living   0 3     Review of Systems  Respiratory: Positive for shortness of breath.   All other systems reviewed and are negative.     Allergies  Review of patient's allergies indicates no known allergies.  Home Medications   Prior to Admission medications   Medication Sig Start Date End Date Taking? Authorizing Provider  acetaminophen (TYLENOL) 500 MG tablet Take 1,000 mg by mouth every 6 (six) hours as needed for mild pain or headache.    Historical Provider, MD  Calcium Carb-Cholecalciferol (CALCIUM + D3 PO) Take 1 tablet by mouth daily.    Historical Provider, MD  docusate sodium (COLACE) 100 MG capsule Take 200 mg by mouth daily as needed. For constipation    Historical Provider, MD  ibuprofen (ADVIL,MOTRIN) 600 MG tablet Take 1 tablet (600 mg total) by mouth every 6 (six) hours. 09/06/14   Marlinda Mike, CNM  levothyroxine (SYNTHROID, LEVOTHROID) 112 MCG tablet Take 112 mcg by mouth daily before breakfast.    Historical Provider, MD  ondansetron (ZOFRAN) 4 MG tablet Take 1 tablet (4 mg total) by mouth every 6 (six) hours. 06/29/15   Dione Booze, MD  Prenatal Vit-Fe Fumarate-FA (PRENATAL MULTIVITAMIN) TABS Take 1 tablet by mouth daily.    Historical Provider, MD   BP 104/74 mmHg  Pulse 78  Temp(Src) 97.6 F (36.4 C) (Oral)  Resp 18  Ht  (1.575 m)  Wt 150 lb 2 oz (68.096 kg)  BMI 27.45 kg/m2  SpO2 98%  LMP 06/22/2015 (Approximate) Physical Exam  Nursing note  and vitals reviewed.  35 year old female, resting comfortably and in no acute distress. Vital signs are normal. Oxygen saturation is 98%, which is normal. Head is normocephalic and atraumatic. PERRLA, EOMI. Oropharynx is clear. Neck is nontender and supple without adenopathy or JVD. Back is nontender and there is no CVA tenderness. Lungs are clear without rales, wheezes, or rhonchi. Chest is nontender. Heart has regular rate and rhythm without murmur. Abdomen is soft, flat, nontender without masses or hepatosplenomegaly and peristalsis is hoactive. Extremities have no cyanosis or edema, full range of motion is present. Skin is warm and dry without rash. Neurologic: Mental status is normal, cranial nerves are intact, there are no motor or sensory deficits.  ED Course  Procedures (including critical care time) Labs Review Results for orders placed or performed during the hospital encounter of 06/29/15  Lipase, blood  Result Value Ref Range   Lipase 34 11 - 51 U/L  Comprehensive metabolic panel  Result Value Ref Range   Sodium 137 135 - 145 mmol/L   Potassium 3.6 3.5 - 5.1 mmol/L   Chloride 103 101 - 111 mmol/L   CO2 23 22 - 32 mmol/L   Glucose, Bld 133 (H) 65 - 99 mg/dL   BUN 15 6 - 20 mg/dL   Creatinine, Ser 1.61 0.44 - 1.00 mg/dL   Calcium 8.6 (L) 8.9 - 10.3 mg/dL   Total Protein 6.7 6.5 - 8.1 g/dL   Albumin 3.8 3.5 - 5.0 g/dL   AST 14 (L) 15 - 41 U/L   ALT 13 (L) 14 - 54 U/L   Alkaline Phosphatase 45 38 - 126 U/L   Total Bilirubin 1.3 (H) 0.3 - 1.2 mg/dL   GFR calc non Af Amer >60 >60 mL/min   GFR calc Af Amer >60 >60 mL/min   Anion gap 11 5 - 15  CBC  Result Value Ref Range   WBC 12.8 (H) 4.0 - 10.5 K/uL   RBC 4.73 3.87 - 5.11 MIL/uL   Hemoglobin 15.0 12.0 - 15.0 g/dL   HCT 09.6 04.5 - 40.9 %   MCV 92.0 78.0 - 100.0 fL   MCH 31.7 26.0 - 34.0 pg   MCHC 34.5 30.0 - 36.0 g/dL   RDW 81.1 91.4 - 78.2 %   Platelets 191 150 - 400 K/uL  Urinalysis, Routine w reflex  microscopic (not at [facility])  Result Value Ref Range   Color, Urine YELLOW YELLOW   APPearance CLEAR CLEAR   Specific Gravity, Urine 1.041 (H) 1.005 - 1.030   pH 6.5 5.0 - 8.0   Glucose, UA NEGATIVE NEGATIVE mg/dL   Hgb urine dipstick NEGATIVE NEGATIVE   Bilirubin Urine NEGATIVE NEGATIVE   Ketones, ur NEGATIVE NEGATIVE mg/dL   Protein, ur NEGATIVE NEGATIVE mg/dL   Nitrite NEGATIVE NEGATIVE   Leukocytes, UA NEGATIVE NEGATIVE  POC  urine preg, ED ([facility])  Result Value Ref Range   Preg Test, Ur NEGATIVE NEGATIVE   Imaging Review Dg Chest 2 View  06/29/2015  CLINICAL DATA:  Shortness of breath for 1 day.  Nonsmoker. EXAM: CHEST  2 VIEW COMPARISON:  None. FINDINGS: The heart size and mediastinal contours are within normal limits. Both lungs are clear. The visualized skeletal structures are unremarkable. IMPRESSION: No active cardiopulmonary disease. Electronically Signed   By: Burman NievesWilliam  Stevens M.D.   On: 06/29/2015 01:45   I have personally reviewed and evaluated these images and lab results as part of my medical decision-making.   EKG Interpretation   Date/Time:  Sunday June 29 2015 00:28:27 EDT Ventricular Rate:  86 PR Interval:  122 QRS Duration: 84 QT Interval:  368 QTC Calculation: 440 R Axis:   80 Text Interpretation:  Normal sinus rhythm Nonspecific ST and T wave  abnormality Abnormal ECG No significant change since last tracing  Confirmed by LITTLE MD, RACHEL ([id number]) on 06/29/2015 3:35:00 AM      MDM   Final diagnoses:  Nausea  Dyspnea    Nausea and dyspnea. Dyspnea has resolved. This most likely is a viral illness. Laboratory workup is significant for concentrated urine and blood sugar of 133. I have told the patient that this likely is pre-diabetes and will need to be followed by her PCP. Of note, she did not have any problems with elevated blood sugar during pregnancy. She was offered IV hydration, but stated that she would ask you prefer to go home because her  child needs to be breast-fed. She is discharged with prescription for ondansetron.    Dione Boozeavid Hakeem Frazzini, MD 06/29/15 [phone]

## 2015-06-29 NOTE — Discharge Instructions (Signed)
Nausea, Adult Nausea is the feeling that you have an upset stomach or have to vomit. Nausea by itself is not likely a serious concern, but it may be an early sign of more serious medical problems. As nausea gets worse, it can lead to vomiting. If vomiting develops, there is the risk of dehydration.  CAUSES   Viral infections.  Food poisoning.  Medicines.  Pregnancy.  Motion sickness.  Migraine headaches.  Emotional distress.  Severe pain from any source.  Alcohol intoxication. HOME CARE INSTRUCTIONS  Get plenty of rest.  Ask your caregiver about specific rehydration instructions.  Eat small amounts of food and sip liquids more often.  Take all medicines as told by your caregiver. SEEK MEDICAL CARE IF:  You have not improved after 2 days, or you get worse.  You have a headache. SEEK IMMEDIATE MEDICAL CARE IF:   You have a fever.  You faint.  You keep vomiting or have blood in your vomit.  You are extremely weak or dehydrated.  You have dark or bloody stools.  You have severe chest or abdominal pain. MAKE SURE YOU:  Understand these instructions.  Will watch your condition.  Will get help right away if you are not doing well or get worse.   This information is not intended to replace advice given to you by your health care provider. Make sure you discuss any questions you have with your health care provider.   Document Released: 04/01/2004 Document Revised: 03/15/2014 Document Reviewed: 11/04/2010 Elsevier Interactive Patient Education 2016 [employer].  Shortness of Breath Shortness of breath means you have trouble breathing. It could also mean that you have a medical problem. You should get immediate medical care for shortness of breath. CAUSES   Not enough oxygen in the air such as with high altitudes or a smoke-filled room.  Certain lung diseases, infections, or problems.  Heart disease or conditions, such as angina or heart failure.  Low red  blood cells (anemia).  Poor physical fitness, which can cause shortness of breath when you exercise.  Chest or back injuries or stiffness.  Being overweight.  Smoking.  Anxiety, which can make you feel like you are not getting enough air. DIAGNOSIS  Serious medical problems can often be found during your physical exam. Tests may also be done to determine why you are having shortness of breath. Tests may include:  Chest X-rays.  Lung function tests.  Blood tests.  An electrocardiogram (ECG).  An ambulatory electrocardiogram. An ambulatory ECG records your heartbeat patterns over a 24-hour period.  Exercise testing.  A transthoracic echocardiogram (TTE). During echocardiography, sound waves are used to evaluate how blood flows through your heart.  A transesophageal echocardiogram (TEE).  Imaging scans. Your health care provider may not be able to find a cause for your shortness of breath after your exam. In this case, it is important to have a follow-up exam with your health care provider as directed.  TREATMENT  Treatment for shortness of breath depends on the cause of your symptoms and can vary greatly. HOME CARE INSTRUCTIONS   Do not smoke. Smoking is a common cause of shortness of breath. If you smoke, ask for help to quit.  Avoid being around chemicals or things that may bother your breathing, such as paint fumes and dust.  Rest as needed. Slowly resume your usual activities.  If medicines were prescribed, take them as directed for the full length of time directed. This includes oxygen and any inhaled medicines.  Keep all follow-up appointments as directed by your health care provider. SEEK MEDICAL CARE IF:   Your condition does not improve in the time expected.  You have a hard time doing your normal activities even with rest.  You have any new symptoms. SEEK IMMEDIATE MEDICAL CARE IF:   Your shortness of breath gets worse.  You feel light-headed, faint, or  develop a cough not controlled with medicines.  You start coughing up blood.  You have pain with breathing.  You have chest pain or pain in your arms, shoulders, or abdomen.  You have a fever.  You are unable to walk up stairs or exercise the way you normally do. MAKE SURE YOU:  Understand these instructions.  Will watch your condition.  Will get help right away if you are not doing well or get worse.   This information is not intended to replace advice given to you by your health care provider. Make sure you discuss any questions you have with your health care provider.   Document Released: 11/17/2000 Document Revised: 02/27/2013 Document Reviewed: 05/10/2011 Elsevier Interactive Patient Education 2016 Elsevier Inc.  Ondansetron tablets What is this medicine? ONDANSETRON (on DAN se tron) is used to treat nausea and vomiting caused by chemotherapy. It is also used to prevent or treat nausea and vomiting after surgery. This medicine may be used for other purposes; ask your health care provider or pharmacist if you have questions. What should I tell my health care provider before I take this medicine? They need to know if you have any of these conditions: -heart disease -history of irregular heartbeat -liver disease -low levels of magnesium or potassium in the blood -an unusual or allergic reaction to ondansetron, granisetron, other medicines, foods, dyes, or preservatives -pregnant or trying to get pregnant -breast-feeding How should I use this medicine? Take this medicine by mouth with a glass of water. Follow the directions on your prescription label. Take your doses at regular intervals. Do not take your medicine more often than directed. Talk to your pediatrician regarding the use of this medicine in children. Special care may be needed. Overdosage: If you think you have taken too much of this medicine contact a poison control center or emergency room at once. NOTE: This  medicine is only for you. Do not share this medicine with others. What if I miss a dose? If you miss a dose, take it as soon as you can. If it is almost time for your next dose, take only that dose. Do not take double or extra doses. What may interact with this medicine? Do not take this medicine with any of the following medications: -apomorphine -certain medicines for fungal infections like fluconazole, itraconazole, ketoconazole, posaconazole, voriconazole -cisapride -dofetilide -dronedarone -pimozide -thioridazine -ziprasidone This medicine may also interact with the following medications: -carbamazepine -certain medicines for depression, anxiety, or psychotic disturbances -fentanyl -linezolid -MAOIs like Carbex, Eldepryl, Marplan, Nardil, and Parnate -methylene blue (injected into a vein) -other medicines that prolong the QT interval (cause an abnormal heart rhythm) -phenytoin -rifampicin -tramadol This list may not describe all possible interactions. Give your health care provider a list of all the medicines, herbs, non-prescription drugs, or dietary supplements you use. Also tell them if you smoke, drink alcohol, or use illegal drugs. Some items may interact with your medicine. What should I watch for while using this medicine? Check with your doctor or health care professional right away if you have any sign of an allergic reaction. What side effects  may I notice from receiving this medicine? Side effects that you should report to your doctor or health care professional as soon as possible: -allergic reactions like skin rash, itching or hives, swelling of the face, lips or tongue -breathing problems -confusion -dizziness -fast or irregular heartbeat -feeling faint or lightheaded, falls -fever and chills -loss of balance or coordination -seizures -sweating -swelling of the hands or feet -tightness in the chest -tremors -unusually weak or tired Side effects that  usually do not require medical attention (report to your doctor or health care professional if they continue or are bothersome): -constipation or diarrhea -headache This list may not describe all possible side effects. Call your doctor for medical advice about side effects. You may report side effects to FDA at 1-800-FDA-1088. Where should I keep my medicine? Keep out of the reach of children. Store between 2 and 30 degrees C (36 and 86 degrees F). Throw away any unused medicine after the expiration date. NOTE: This sheet is a summary. It may not cover all possible information. If you have questions about this medicine, talk to your doctor, pharmacist, or health care provider.    2016, Elsevier/Gold Standard. (2012-11-29 16:27:45)

## 2015-06-29 NOTE — ED Notes (Signed)
Pt. reports SOB onset last night denies cough or chest congestion , mild nausea with no emesis . Denies fever or chills.

## 2015-06-29 NOTE — ED Notes (Signed)
Pt verbalized understanding of discharge instructions, prescriptions and follow up care.

## 2016-06-29 DIAGNOSIS — Z0000 Encounter for general adult medical examination without abnormal findings: Secondary | ICD-10-CM | POA: Diagnosis not present

## 2017-01-13 DIAGNOSIS — R739 Hyperglycemia, unspecified: Secondary | ICD-10-CM | POA: Diagnosis not present

## 2017-01-13 DIAGNOSIS — E039 Hypothyroidism, unspecified: Secondary | ICD-10-CM | POA: Diagnosis not present

## 2017-01-17 DIAGNOSIS — E063 Autoimmune thyroiditis: Secondary | ICD-10-CM | POA: Diagnosis not present

## 2017-01-17 DIAGNOSIS — E049 Nontoxic goiter, unspecified: Secondary | ICD-10-CM | POA: Diagnosis not present

## 2017-01-17 DIAGNOSIS — E039 Hypothyroidism, unspecified: Secondary | ICD-10-CM | POA: Diagnosis not present

## 2017-01-17 DIAGNOSIS — Z23 Encounter for immunization: Secondary | ICD-10-CM | POA: Diagnosis not present

## 2017-03-23 DIAGNOSIS — E039 Hypothyroidism, unspecified: Secondary | ICD-10-CM | POA: Diagnosis not present

## 2017-03-28 DIAGNOSIS — R590 Localized enlarged lymph nodes: Secondary | ICD-10-CM | POA: Diagnosis not present

## 2017-05-30 DIAGNOSIS — E063 Autoimmune thyroiditis: Secondary | ICD-10-CM | POA: Diagnosis not present

## 2017-05-30 DIAGNOSIS — Z13220 Encounter for screening for lipoid disorders: Secondary | ICD-10-CM | POA: Diagnosis not present

## 2017-05-30 DIAGNOSIS — Z5181 Encounter for therapeutic drug level monitoring: Secondary | ICD-10-CM | POA: Diagnosis not present

## 2017-07-06 DIAGNOSIS — Z23 Encounter for immunization: Secondary | ICD-10-CM | POA: Diagnosis not present

## 2017-07-06 DIAGNOSIS — Z0000 Encounter for general adult medical examination without abnormal findings: Secondary | ICD-10-CM | POA: Diagnosis not present

## 2017-09-12 DIAGNOSIS — N3281 Overactive bladder: Secondary | ICD-10-CM | POA: Diagnosis not present

## 2017-09-12 DIAGNOSIS — M62838 Other muscle spasm: Secondary | ICD-10-CM | POA: Diagnosis not present

## 2017-09-12 DIAGNOSIS — M6281 Muscle weakness (generalized): Secondary | ICD-10-CM | POA: Diagnosis not present

## 2017-09-20 DIAGNOSIS — M6281 Muscle weakness (generalized): Secondary | ICD-10-CM | POA: Diagnosis not present

## 2017-09-20 DIAGNOSIS — M62838 Other muscle spasm: Secondary | ICD-10-CM | POA: Diagnosis not present

## 2017-09-29 DIAGNOSIS — M62838 Other muscle spasm: Secondary | ICD-10-CM | POA: Diagnosis not present

## 2017-09-29 DIAGNOSIS — M6281 Muscle weakness (generalized): Secondary | ICD-10-CM | POA: Diagnosis not present

## 2017-10-14 DIAGNOSIS — M62838 Other muscle spasm: Secondary | ICD-10-CM | POA: Diagnosis not present

## 2017-10-14 DIAGNOSIS — R102 Pelvic and perineal pain: Secondary | ICD-10-CM | POA: Diagnosis not present

## 2017-10-14 DIAGNOSIS — M6281 Muscle weakness (generalized): Secondary | ICD-10-CM | POA: Diagnosis not present

## 2017-10-28 DIAGNOSIS — R102 Pelvic and perineal pain: Secondary | ICD-10-CM | POA: Diagnosis not present

## 2017-10-28 DIAGNOSIS — M62838 Other muscle spasm: Secondary | ICD-10-CM | POA: Diagnosis not present

## 2017-10-28 DIAGNOSIS — M6281 Muscle weakness (generalized): Secondary | ICD-10-CM | POA: Diagnosis not present

## 2017-11-13 IMAGING — DX DG CHEST 2V
2 series · 2 of 2 positions shown · non-contrast
Comparison: None.

CLINICAL DATA: Shortness of breath for 1 day.  Nonsmoker.

EXAM:
CHEST  2 VIEW

[chest pa]
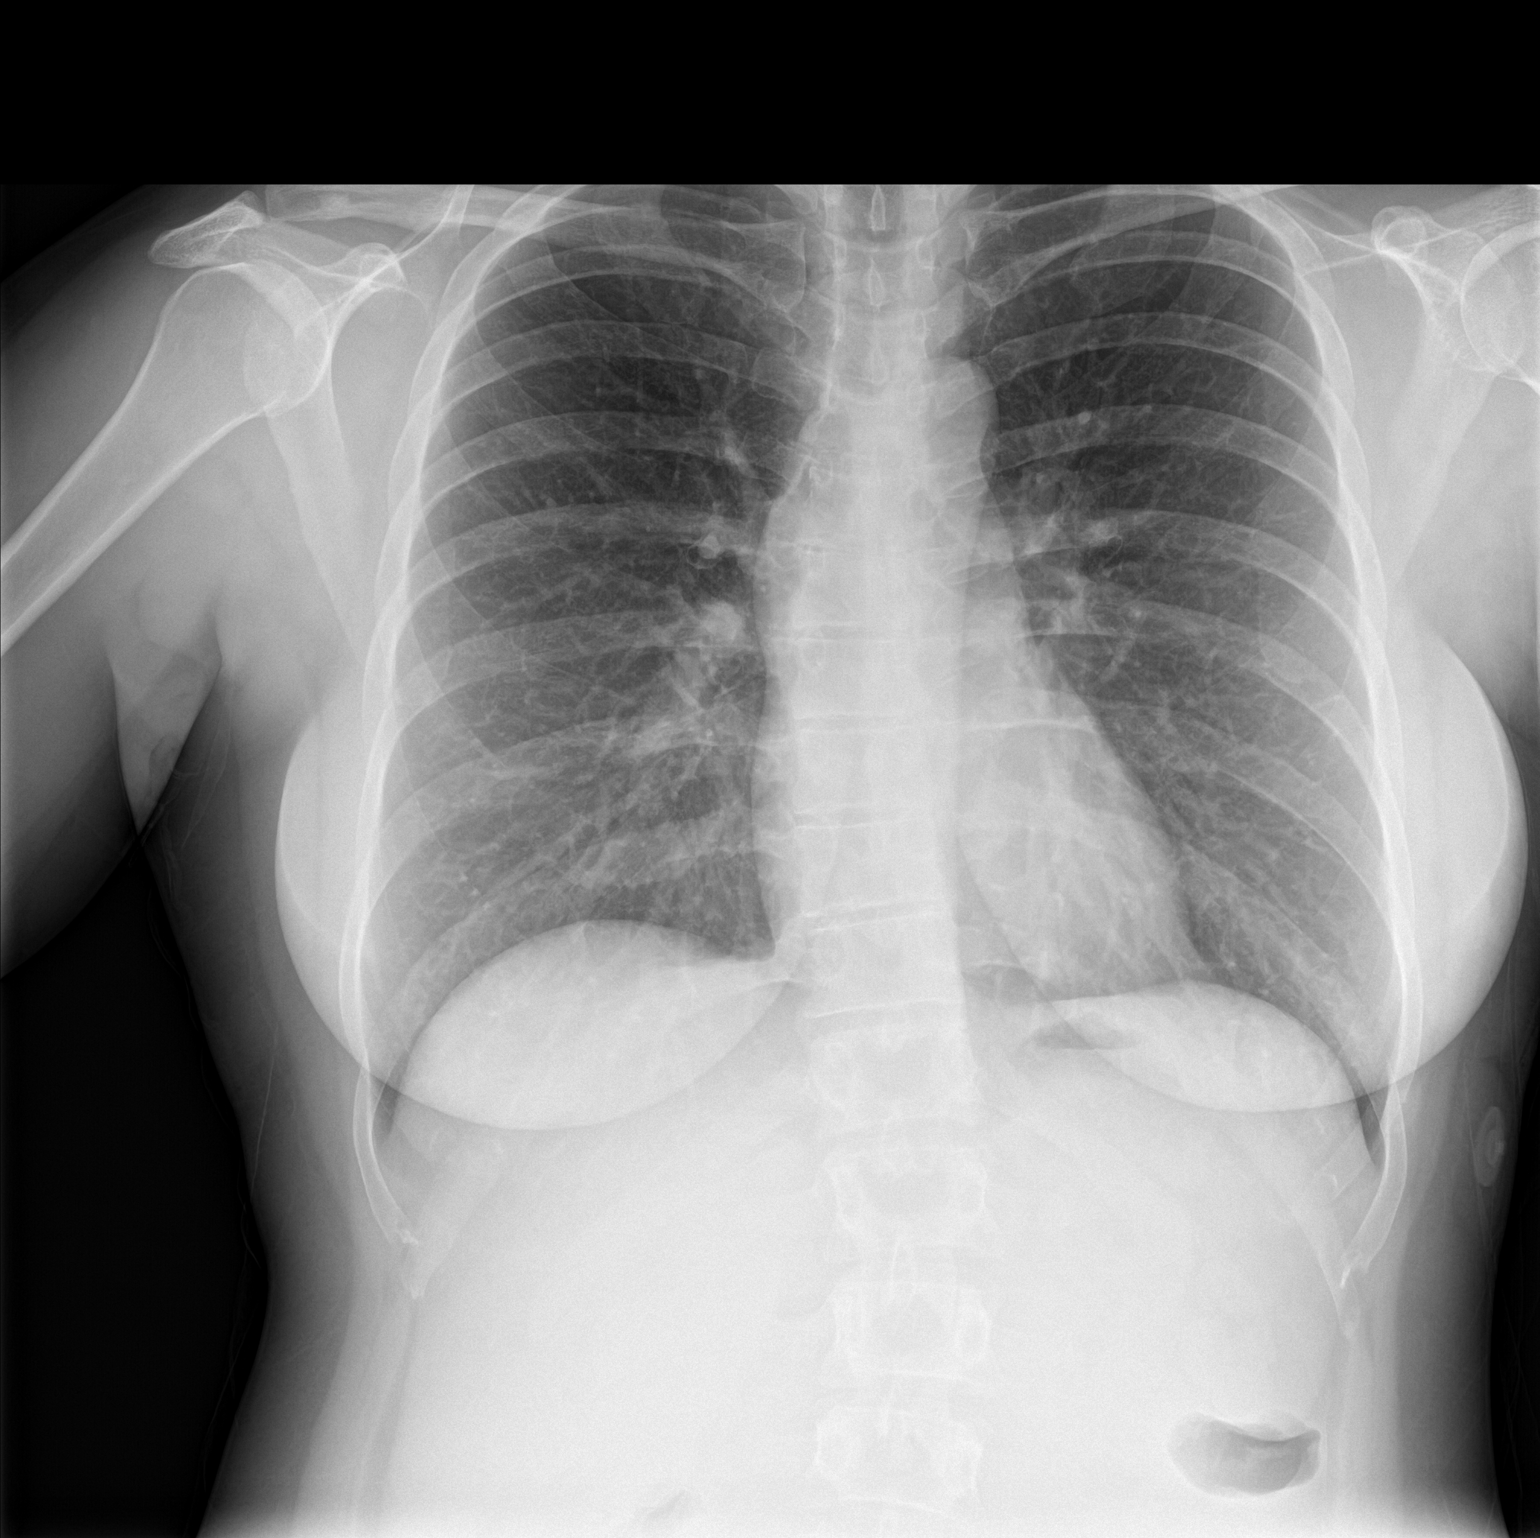

[chest lat]
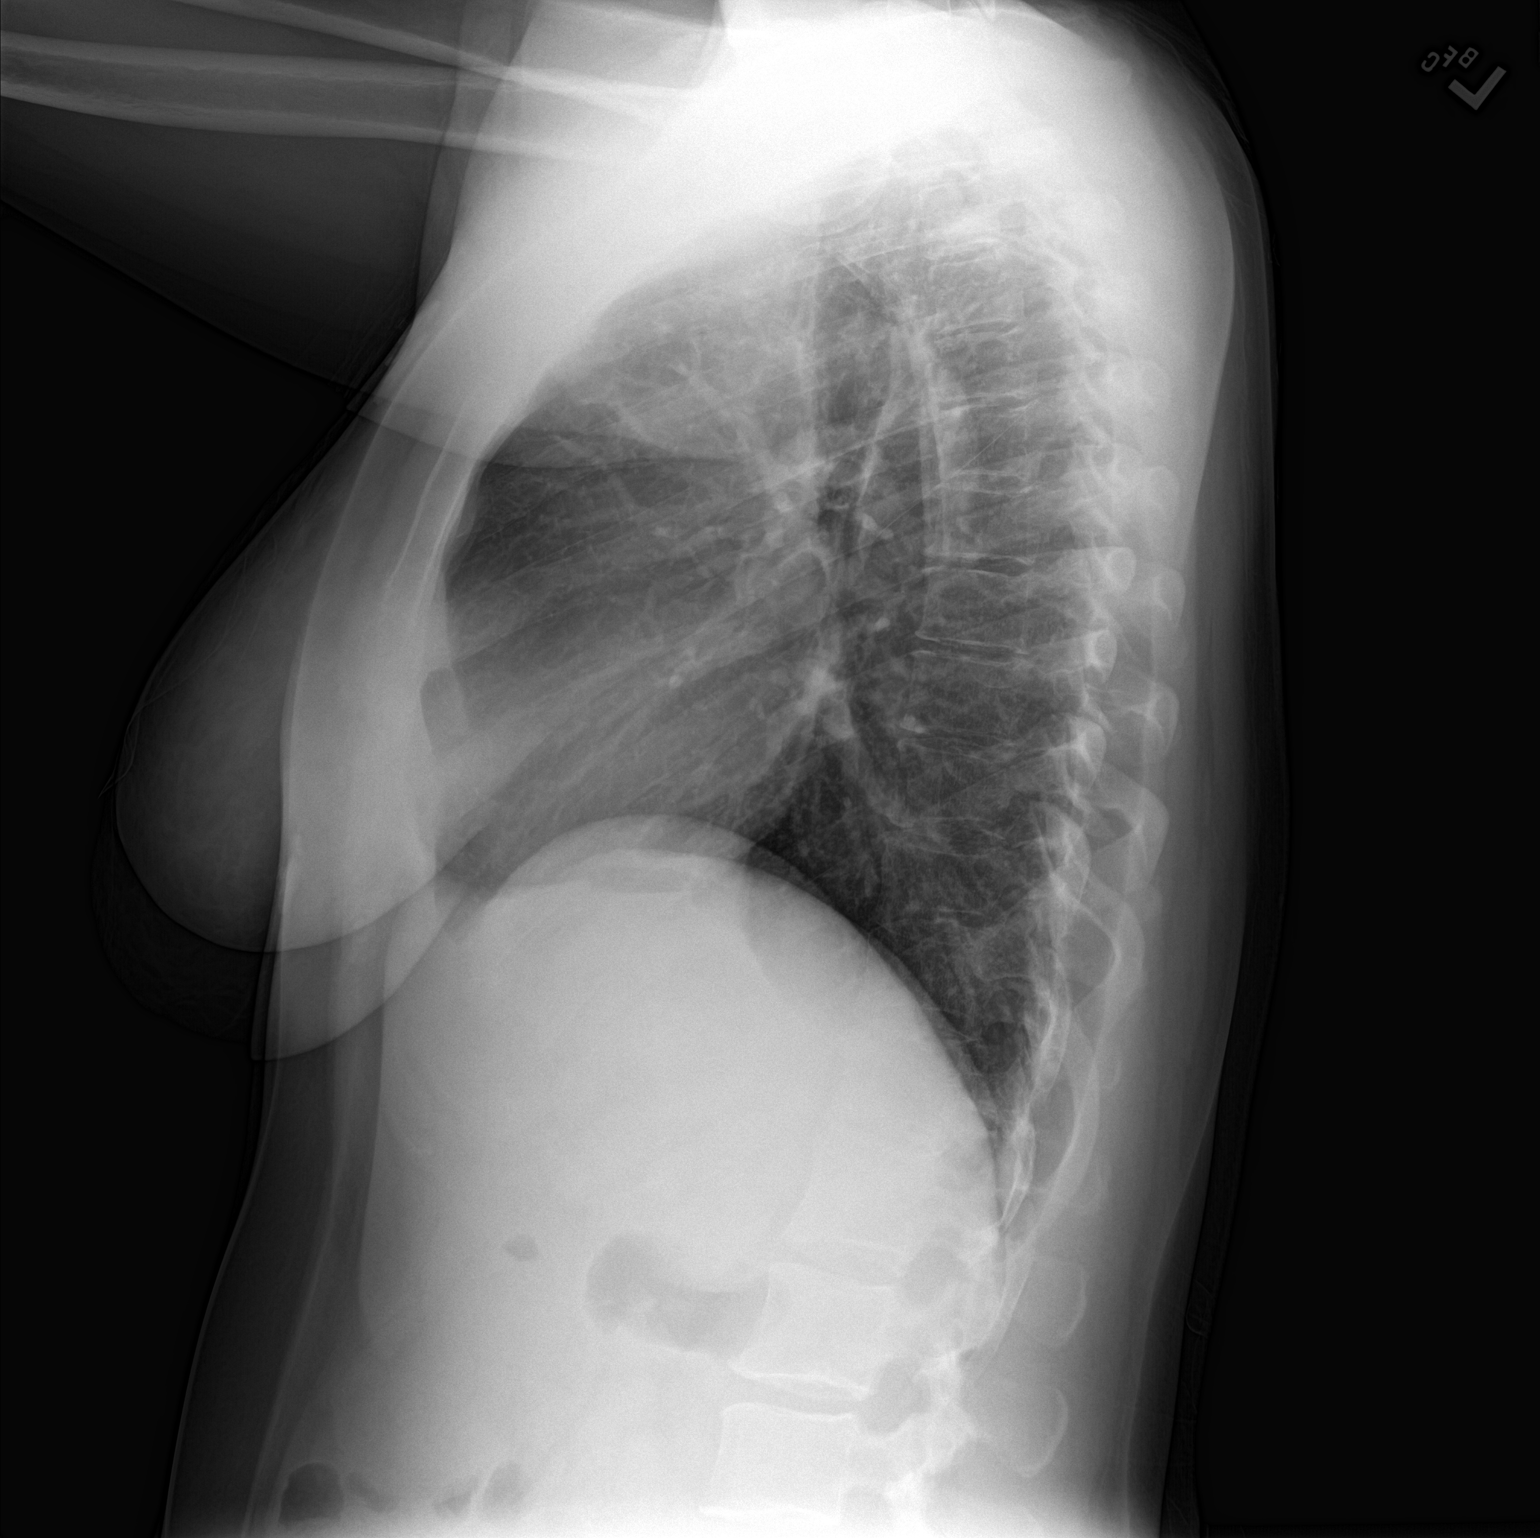

[2 of 2 positions shown; findings below may reference images not displayed]

FINDINGS: The heart size and mediastinal contours are within normal limits.
Both lungs are clear. The visualized skeletal structures are
unremarkable.
IMPRESSION: No active cardiopulmonary disease.

## 2017-11-18 DIAGNOSIS — M6281 Muscle weakness (generalized): Secondary | ICD-10-CM | POA: Diagnosis not present

## 2017-11-18 DIAGNOSIS — M62838 Other muscle spasm: Secondary | ICD-10-CM | POA: Diagnosis not present

## 2017-12-07 DIAGNOSIS — N393 Stress incontinence (female) (male): Secondary | ICD-10-CM | POA: Diagnosis not present

## 2017-12-16 DIAGNOSIS — M6281 Muscle weakness (generalized): Secondary | ICD-10-CM | POA: Diagnosis not present

## 2017-12-16 DIAGNOSIS — M62838 Other muscle spasm: Secondary | ICD-10-CM | POA: Diagnosis not present

## 2017-12-16 DIAGNOSIS — N393 Stress incontinence (female) (male): Secondary | ICD-10-CM | POA: Diagnosis not present

## 2018-01-06 DIAGNOSIS — M6281 Muscle weakness (generalized): Secondary | ICD-10-CM | POA: Diagnosis not present

## 2018-01-06 DIAGNOSIS — M62838 Other muscle spasm: Secondary | ICD-10-CM | POA: Diagnosis not present

## 2018-01-06 DIAGNOSIS — N8111 Cystocele, midline: Secondary | ICD-10-CM | POA: Diagnosis not present

## 2018-01-20 DIAGNOSIS — E039 Hypothyroidism, unspecified: Secondary | ICD-10-CM | POA: Diagnosis not present

## 2018-01-23 DIAGNOSIS — E049 Nontoxic goiter, unspecified: Secondary | ICD-10-CM | POA: Diagnosis not present

## 2018-01-23 DIAGNOSIS — E063 Autoimmune thyroiditis: Secondary | ICD-10-CM | POA: Diagnosis not present

## 2018-01-23 DIAGNOSIS — E039 Hypothyroidism, unspecified: Secondary | ICD-10-CM | POA: Diagnosis not present

## 2018-01-23 DIAGNOSIS — Z23 Encounter for immunization: Secondary | ICD-10-CM | POA: Diagnosis not present

## 2018-01-25 DIAGNOSIS — Z01419 Encounter for gynecological examination (general) (routine) without abnormal findings: Secondary | ICD-10-CM | POA: Diagnosis not present

## 2018-01-25 DIAGNOSIS — Z6826 Body mass index (BMI) 26.0-26.9, adult: Secondary | ICD-10-CM | POA: Diagnosis not present

## 2018-02-06 DIAGNOSIS — R102 Pelvic and perineal pain: Secondary | ICD-10-CM | POA: Diagnosis not present

## 2018-02-06 DIAGNOSIS — M6281 Muscle weakness (generalized): Secondary | ICD-10-CM | POA: Diagnosis not present

## 2018-02-06 DIAGNOSIS — M62838 Other muscle spasm: Secondary | ICD-10-CM | POA: Diagnosis not present

## 2018-02-27 DIAGNOSIS — M6281 Muscle weakness (generalized): Secondary | ICD-10-CM | POA: Diagnosis not present

## 2018-02-27 DIAGNOSIS — M62838 Other muscle spasm: Secondary | ICD-10-CM | POA: Diagnosis not present

## 2018-02-27 DIAGNOSIS — N393 Stress incontinence (female) (male): Secondary | ICD-10-CM | POA: Diagnosis not present

## 2018-04-07 DIAGNOSIS — M62838 Other muscle spasm: Secondary | ICD-10-CM | POA: Diagnosis not present

## 2018-04-07 DIAGNOSIS — M6281 Muscle weakness (generalized): Secondary | ICD-10-CM | POA: Diagnosis not present

## 2018-04-07 DIAGNOSIS — N393 Stress incontinence (female) (male): Secondary | ICD-10-CM | POA: Diagnosis not present

## 2018-04-19 DIAGNOSIS — M6281 Muscle weakness (generalized): Secondary | ICD-10-CM | POA: Diagnosis not present

## 2018-04-19 DIAGNOSIS — M62838 Other muscle spasm: Secondary | ICD-10-CM | POA: Diagnosis not present

## 2018-04-19 DIAGNOSIS — R102 Pelvic and perineal pain: Secondary | ICD-10-CM | POA: Diagnosis not present

## 2019-06-09 ENCOUNTER — Ambulatory Visit: Payer: 59 | Attending: Internal Medicine

## 2019-06-09 DIAGNOSIS — Z23 Encounter for immunization: Secondary | ICD-10-CM

## 2019-06-09 NOTE — Progress Notes (Signed)
   Covid-19 Vaccination Clinic  Name:  LEONORE FRANKSON    MRN: [id number] DOB: Dec 14, 1980  06/09/2019  Ms. Goostree was observed post Covid-19 immunization for 15 minutes without incident. She was provided with Vaccine Information Sheet and instruction to access the V-Safe system.   Ms. Blades was instructed to call 911 with any severe reactions post vaccine: Marland Kitchen Difficulty breathing  . Swelling of face and throat  . A fast heartbeat  . A bad rash all over body  . Dizziness and weakness   Immunizations Administered    Name Date Dose VIS Date Route   Pfizer COVID-19 Vaccine 06/09/2019  2:21 PM 0.3 mL 02/16/2019 Intramuscular   Manufacturer: [employer], [employer]   Lot: [id number]   NDC: [address]-1

## 2019-07-03 ENCOUNTER — Ambulatory Visit: Payer: 59 | Attending: Internal Medicine

## 2019-07-03 DIAGNOSIS — Z23 Encounter for immunization: Secondary | ICD-10-CM

## 2019-07-03 NOTE — Progress Notes (Signed)
   Covid-19 Vaccination Clinic  Name:  LYRIKA SOUDERS    MRN: [id number] DOB: 02/17/1981  07/03/2019  Ms. Burress was observed post Covid-19 immunization for 15 minutes without incident. She was provided with Vaccine Information Sheet and instruction to access the V-Safe system.   Ms. Poirier was instructed to call 911 with any severe reactions post vaccine: Marland Kitchen Difficulty breathing  . Swelling of face and throat  . A fast heartbeat  . A bad rash all over body  . Dizziness and weakness   Immunizations Administered    Name Date Dose VIS Date Route   Pfizer COVID-19 Vaccine 07/03/2019  2:40 PM 0.3 mL 05/02/2018 Intramuscular   Manufacturer: [employer], [employer]   Lot: [id number]   NDC: [address]-3

## 2019-10-24 ENCOUNTER — Other Ambulatory Visit: Payer: 59

## 2019-10-24 ENCOUNTER — Other Ambulatory Visit: Payer: Self-pay

## 2019-10-24 DIAGNOSIS — Z20822 Contact with and (suspected) exposure to covid-19: Secondary | ICD-10-CM

## 2019-10-25 LAB — SARS-COV-2, NAA 2 DAY TAT

## 2019-10-25 LAB — NOVEL CORONAVIRUS, NAA: SARS-CoV-2, NAA: NOT DETECTED
